# Patient Record
Sex: Male | Born: 1985 | Race: White | Hispanic: No | Marital: Single
Health system: Midwestern US, Community
[De-identification: ages and names within clinical notes are randomized; demographics above are authoritative.]

## PROBLEM LIST (undated history)

## (undated) DIAGNOSIS — S82899A Other fracture of unspecified lower leg, initial encounter for closed fracture: Secondary | ICD-10-CM

## (undated) DIAGNOSIS — Z915 Personal history of self-harm: Secondary | ICD-10-CM

## (undated) DIAGNOSIS — F209 Schizophrenia, unspecified: Secondary | ICD-10-CM

## (undated) DIAGNOSIS — F319 Bipolar disorder, unspecified: Secondary | ICD-10-CM

## (undated) DIAGNOSIS — F603 Borderline personality disorder: Secondary | ICD-10-CM

---
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
## 2010-12-08 NOTE — ED Notes (Signed)
[employer] placed on tray at bedside per patient's request.
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
## 2010-12-08 NOTE — ED Notes (Signed)
"  Hearing voices telling me to kill myself and hurt somebody else."

## 2010-12-09 LAB — DRUG SCREEN, URINE
ACETAMINOPHEN: NEGATIVE
AMPHETAMINES: NEGATIVE
BARBITURATES: NEGATIVE
BENZODIAZEPINES: NEGATIVE
COCAINE: NEGATIVE
METHADONE: NEGATIVE
Methamphetamines: NEGATIVE
OPIATES: NEGATIVE
PCP(PHENCYCLIDINE): NEGATIVE
THC (TH-CANNABINOL): POSITIVE — AB
TRICYCLICS: NEGATIVE

## 2010-12-09 LAB — ETHYL ALCOHOL: ALCOHOL(ETHYL),SERUM: <3 MG/DL (ref 0–3)

## 2010-12-09 MED ORDER — ACETAMINOPHEN 500 MG TAB
500 mg | ORAL | Status: AC
Start: 2010-12-09 — End: 2010-12-08
  Administered 2010-12-09: 03:00:00 via ORAL
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
## 2010-12-09 NOTE — ED Notes (Signed)
Pt ambulated to bathroom without difficulty.
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
## 2010-12-09 NOTE — ED Notes (Signed)
Pt resting on stretcher with eyes closed. Awaiting bed placement from crisis stabilization in [place]. Will continue to monitor.

## 2010-12-09 NOTE — ED Notes (Signed)
Pt awake asking for clothes, spoke to [place] at [facility] who states they will not be accepting pt.
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
## 2010-12-09 NOTE — ED Notes (Signed)
Per [facility] officer, Eduard Roux [phone]), updated vital signs need to be sent to [facility].

## 2010-12-09 NOTE — ED Notes (Signed)
[phone] (number); [phone] (fax)

## 2010-12-09 NOTE — ED Notes (Signed)
Crisis counselor, Hessie Diener at bedside.

## 2010-12-09 NOTE — ED Notes (Signed)
[employer] contacted. The unit reports that faxed information was never received. Needed information re-faxed. Arnela RN confirms that information was received.

## 2010-12-09 NOTE — ED Notes (Signed)
Bedside shift change report given to Clair Holden, RN (oncoming nurse) by Derra Shartzer, RN (offgoing nurse).  Report given with ED Summary.

## 2010-12-09 NOTE — Other (Signed)
Unable to locate client in ED. Per nursing staff he has eloped, [employer] notified.

## 2010-12-09 NOTE — ED Notes (Signed)
Patient provided with breakfast tray, sitting up on stretcher eating. Denies any needs at this time.

## 2010-12-09 NOTE — ED Notes (Signed)
Pt walked out, states "I signed myself in and I'm walking out" pt left without belongings, [employer] contacted spoke to officer Melodye Ped.

## 2010-12-09 NOTE — ED Notes (Signed)
[facility] couselor, Darral Dash, at bedside.

## 2010-12-09 NOTE — ED Provider Notes (Addendum)
HPI     Past Medical History   Diagnosis Date   ??? Psychiatric disorder    ??? Paranoid schizophrenia    ??? Heroin addiction    ??? Sciatic nerve pain         Past Surgical History   Procedure Date   ??? Hx orthopaedic      right knee         No family history on file.     History     Social History   ??? Marital Status: Single     Spouse Name: N/A     Number of Children: N/A   ??? Years of Education: N/A     Occupational History   ??? Not on file.     Social History Main Topics   ??? Smoking status: Current Everyday Smoker -- 3.0 packs/day for 16 years   ??? Smokeless tobacco: Not on file   ??? Alcohol Use: Yes      occassionally   ??? Drug Use: Yes     Special: Marijuana, Heroin      Last Heroin use 2 days (snort)   ??? Sexually Active: Yes -- Male partner(s)     Other Topics Concern   ??? Not on file     Social History Narrative   ??? No narrative on file                  ALLERGIES: Review of patient's allergies indicates no known allergies.      Review of Systems    Filed Vitals:    12/08/10 2044 12/09/10 0318 12/09/10 0731   BP: 114/70 92/59 92/60    Pulse: 88 62 68   Temp: 97.1 ??F (36.2 ??C)  98.4 ??F (36.9 ??C)   Resp: 18 16 14    Height: 5\' 9"  (1.753 m)     Weight: 77.6 kg (171 lb 1.2 oz)     SpO2: 99% 100% 97%            Physical Exam     MDM     Differential Diagnosis; Clinical Impression; Plan:     Pt pending crissi stabiliziation placement. No medical complaints. General; AOX3.  Pulmonary; CTA-B.  Cardiac: RRR no MRG; Abd S/NT/ND. Plan:   obs        Procedures

## 2010-12-09 NOTE — Other (Signed)
[facility], Lisette Abu, contacted about current status of bed request to the [facility]. She will notify [facility] as soon as possible.

## 2011-01-25 MED ORDER — OXYCODONE-ACETAMINOPHEN 5 MG-325 MG TAB
5-325 mg | ORAL_TABLET | ORAL | Status: DC | PRN
Start: 2011-01-25 — End: 2013-01-26

## 2011-01-25 NOTE — ED Provider Notes (Signed)
Patient is a 25 y.o. male presenting with rash. The history is provided by the patient.   Rash      C/o 4 day Hx painful rash on right mid back around to right side of his chest.  Noted blisters.  States the pain started the day before the rash appeared.  No fever or chills.  No NVD.  Normal appetite and activity level.  No recent travel.  No other family illness.    Past Medical History   Diagnosis Date   ??? Psychiatric disorder    ??? Paranoid schizophrenia    ??? Heroin addiction    ??? Sciatic nerve pain         Past Surgical History   Procedure Date   ??? Hx orthopaedic      right knee         No family history on file.     History     Social History   ??? Marital Status: Single     Spouse Name: N/A     Number of Children: N/A   ??? Years of Education: N/A     Occupational History   ??? Not on file.     Social History Main Topics   ??? Smoking status: Current Everyday Smoker -- 3.0 packs/day for 16 years   ??? Smokeless tobacco: Not on file   ??? Alcohol Use: Yes      occassionally   ??? Drug Use: Yes     Special: Marijuana, Heroin   ??? Sexually Active: Yes -- Male partner(s)     Other Topics Concern   ??? Not on file     Social History Narrative   ??? No narrative on file                  ALLERGIES: Review of patient's allergies indicates no known allergies.      Review of Systems   Constitutional: Negative for fever, chills, appetite change and fatigue.   HENT: Negative for congestion, rhinorrhea, neck pain, neck stiffness and postnasal drip.    Respiratory: Negative for cough, chest tightness and wheezing.    Cardiovascular: Positive for chest pain. Negative for palpitations and leg swelling.   Gastrointestinal: Negative for nausea, vomiting and abdominal pain.   Musculoskeletal: Positive for myalgias and back pain. Negative for joint swelling and gait problem.   Skin: Positive for rash. Negative for pallor.   Neurological: Negative for dizziness, weakness, light-headedness and headaches.       Filed Vitals:    01/25/11 1540    BP: 125/70   Pulse: 95   Temp: 99.3 ??F (37.4 ??C)   Resp: 16   Height: 5\' 11"  (1.803 m)   Weight: 79.379 kg (175 lb)   SpO2: 98%            Physical Exam   Constitutional: He is oriented to person, place, and time. He appears well-developed and well-nourished. No distress.   HENT:   Head: Normocephalic and atraumatic.   Neck: Normal range of motion. Neck supple.   Cardiovascular: Normal rate, regular rhythm and normal heart sounds.    Pulmonary/Chest: Effort normal and breath sounds normal. He exhibits tenderness.   Abdominal: Soft. Bowel sounds are normal. He exhibits no distension. There is no tenderness. There is no rebound and no guarding.   Musculoskeletal: Normal range of motion.   Neurological: He is alert and oriented to person, place, and time.   Skin: Skin is warm and dry. Rash noted.  erythmatous patchy rash right mid back around to right side chest.  No induration        MDM    Procedures

## 2011-01-25 NOTE — ED Notes (Signed)
Awaiting co signature for discharge.

## 2011-01-25 NOTE — ED Notes (Signed)
C/O blisters on chest radiating to back over the past 3 days

## 2011-01-25 NOTE — ED Provider Notes (Signed)
I was personally available for consultation in the emergency department.  I have reviewed the chart and agree with the documentation recorded by the MLP, including the assessment, treatment plan, and disposition.  Roselene Gray E Mckaylin Bastien

## 2011-01-25 NOTE — ED Notes (Signed)
Pt with noted multiple areas of blistering to mid back and some to right side of chest.  Pt reports pain x 3 with onset.  Pt denies any nausea or vomiting.  Pt denies any other concerns and presents to ED in NAD.

## 2011-01-25 NOTE — ED Notes (Signed)
I have reviewed discharge instructions with the patient. Prescriptions were reveiwed with patient and patient instructed not to drink alcohol, drive a car, or operate heavy machinery while taking this medicine. The patient verbalized understanding. Patient seen leaving ED  Ambulatory without difficulty, in no sign of distress. Patient armband removed and shredded

## 2012-08-30 NOTE — ED Provider Notes (Signed)
[facility]  EMERGENCY DEPARTMENT TREATMENT REPORT  NAME:  Gabriel Robinson  SEX:   M  ADMIT: 08/30/2012  DOB:   [birth year]  MR#    [id number]  ROOM:    TIME SEEN: 02 35 PM  ACCT#  [id number]    cc: Ila Mcgill MD    TIME OF EVALUATION:  1334    PRIMARY CARE PROVIDER:  Ila Mcgill, MD    CHIEF COMPLAINT:  Low back pain.    HISTORY OF PRESENT ILLNESS:  This is a 27 year old male presenting to the Emergency Department secondary to   what he calls the sciatic nerve.  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  States that he believes he may have worked   too hard.  Soon thereafter began developing low back pain, worse on the right   hand side, sharp, radiating down his right leg.  He does have history of   having sciatica in the past.  He has not taken any over-the-counter   medications for his pain, but due to his symptoms, worsening pain, he is here   for further evaluation.    REVIEW OF SYSTEMS:  CONSTITUTIONAL:  No fever.  RESPIRATORY:  No shortness of breath.  CARDIOVASCULAR:  No chest pain.  GASTROINTESTINAL:  No abdominal pain, vomiting, diarrhea.  MUSCULOSKELETAL:  Positive for back pain radiating to the right leg.    PAST MEDICAL HISTORY:  Sciatica, schizophrenia, anxiety.    SOCIAL HISTORY:  Positive for tobacco use.    FAMILY HISTORY:  Noncontributory.    MEDICATIONS:  None.    ALLERGIES:  NO KNOWN DRUG ALLERGIES.    PHYSICAL EXAMINATION:  VITAL SIGNS:  Blood pressure 130/84, pulse 84, respirations 17, temperature   98.4, pain 8 out of 10, O2 saturation 97% on room air.  GENERAL:  The patient well nourished, well developed, answering questions   appropriately, in no acute distress.  ENT:  Mouth:  Mucous membranes moist.  RESPIRATORY:  Clear and equal breath sounds.  No respiratory distress,   tachypnea, or accessory muscle use.    CARDIOVASCULAR:  Heart regular, without murmurs, gallops, rubs, or thrills.   GASTROINTESTINAL:  Normoactive bowel sounds.  Abdomen soft and nontender.  MUSCULOSKELETAL:  The patient has no localized midline tenderness to the   cervical, thoracic, lumbar, or sacral bodies.  He is tender paravertebrally of   the low lumbar spine on the right hand side extending into the buttocks   region.  Movement of the back does reproduce pain.  The patient does have a   positive straight leg raise to the right leg.  NEUROLOGIC:  Sensation to bilateral lower extremities intact.  Motor strength   equal and symmetric with dorsi and plantar flexion.    INITIAL ASSESSMENT AND MANAGEMENT PLAN:  This is a 27 year old male here with low back pain, radicular symptoms to the   right leg, suspect secondary to sciatica.  The patient medicated for his pain   while here with IM Dilaudid, IM Toradol, and Zofran ODT.  Based on his   worsening symptoms, I will go ahead and start him on a steroid taper.  He   otherwise denies any bowel or bladder incontinence.  The patient will also be   given Norco and Robaxin for pain and muscle relaxation.  The patient to follow   with his doctor, return for new or worsening symptoms.    CLINICAL IMPRESSION AND DIAGNOSIS:  Sciatica.  DISPOSITION:  The patient discharged home, instructed as above.    The patient was personally evaluated by myself and Dr. Henrene Hawking who agrees with   the above assessment and plan.      ___________________  Konrad Felix MD  Dictated By: Kristeen Mans, PA    My signature above authenticates this document and my orders, the final   diagnosis (es), discharge prescription (s), and instructions in the [facility]   Pulsecheck record.    If you have any questions please contact [phone].    SD  D:08/30/2012 14:35:51  T: 08/30/2012 14:58:53  [id number]  Authenticated by Janett Billow. Henrene Hawking, M.D. On 09/13/2012 02:58:00 PM

## 2013-01-04 NOTE — ED Provider Notes (Signed)
[facility]  EMERGENCY DEPARTMENT TREATMENT REPORT  NAME:  Gabriel Robinson  SEX:   M  ADMIT: 01/04/2013  DOB:   [birth year]  MR#    [id number]  ROOM:    TIME DICTATED: 09 00 PM  ACCT#  [id number]        ADDENDUM BY DR. Nedra Hai WOODARD:     I interviewed and examined the patient.  I discussed with the mid-level   provider and agree with their evaluation and plan as documented here.  The   patient called me back to the room after the nurse tried to discharge him,   asking for narcotic pain medication to be prescribed.  He states he usually   takes 30 mg of oxycodone a day for his back pain, but he has not been able to   get in to see Dr. Madelin Rear because of his new job and he has run out of this   medication.  I told him we do not prescribe medications for chronic pain.  He   did get a dose of Dilaudid here IM and I will give him a prescriptions for six   5 mg oxycodone tablets, but he understands this is not going to be repeated.      ___________________  Pennie Rushing MD  Dictated By: Bettey Mare. Earlene Plater, PA-C    My signature above authenticates this document and my orders, the final  diagnosis (es), discharge prescription (s), and instructions in the [facility]   Pulsecheck record.  Nursing notes have been reviewed by the physician/mid-level provider.    If you have any questions please contact [phone].    JM  D:01/04/2013 21:00:06  T: 01/04/2013 21:23:34  [id number]  Authenticated by Earvin Hansen. Clydene Pugh, M.D. On 01/09/2013 04:17:18 PM

## 2013-01-04 NOTE — ED Provider Notes (Signed)
[facility]  EMERGENCY DEPARTMENT TREATMENT REPORT  NAME:  Gabriel Robinson  SEX:   M  ADMIT: 01/04/2013  DOB:   [birth year]  MR#    [id number]  ROOM:    TIME DICTATED: 08 27 PM  ACCT#  [id number]    cc: Ila Mcgill MD    TIME OF SERVICE:   1947     CHIEF COMPLAINT:  Low back pain.    HISTORY OF PRESENT ILLNESS:  This is a 27 year old male who has had right-sided back pain radiating down   his right lower extremity for a long time.  He says the pain is there all the   time, but is increased with movement.  He has had it for a long time and is   treated by his primary care physician, Dr. Madelin Rear, but his Medicaid just ran   out, so he presents here for the pain.  He states today it got exacerbated at   work.  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  States the pain at this   time is a 9 out of 10, especially when he is moving.  He is able to move, but   it makes the pain worse.  He is denying any numbness or weakness of his lower   extremities.  The pain is aching, throbbing in nature, and occasionally sharp   down his leg.  The pain was worsened at work today.  He is denying any saddle   paresthesias, change in bowel or bladder habits, recent fevers, chills or   abdominal pain.  The pain is typical for him and not unusual.    REVIEW OF SYSTEMS:  CONSTITUTIONAL:  No fevers or chills.  GASTROINTESTINAL:  No abdominal pain.  GENITOURINARY:  No change in bowel or bladder habits.  MUSCULOSKELETAL:  Positive right-sided back pain radiating down right lower   extremity.  INTEGUMENTARY:  No open wounds or rashes.  NEUROLOGIC:  No sensory or motor deficit to the lower extremities.    PAST MEDICAL HISTORY:  Sciatica, schizophrenia and anxiety.    FAMILY HISTORY:  Noncontributory.    SOCIAL HISTORY:  He is a tobacco smoker.  Denies alcohol or drug use.    ALLERGIES:  REVIEWED IN IBEX.    MEDICATIONS:  Reviewed in Ibex.    PHYSICAL EXAMINATION:  VITAL SIGNS:  Blood pressure 141/84, pulse 67, respirations 18,  temperature   97.8, sats 97% on room air.  GENERAL APPEARANCE: The patient appears well developed and well nourished.    Appearance and behavior are age and situation appropriate.   RESPIRATORY:  Clear and equal breath sounds.  No respiratory distress,   tachypnea, or accessory muscle use.   CARDIOVASCULAR:  Heart regular, without murmurs, gallops, rubs, or thrills.   NECK:  Supple, symmetrical.  Trachea is midline.  He is moving his neck   appropriately on exam with no midline or paraspinal neck discomfort to   palpation.  GASTROINTESTINAL:  Abdomen soft, nontender to palpation.  MUSCULOSKELETAL:   Spine:  There is no localized cervical, thoracic, lumbar or   sacral bony tenderness to palpation or fist percussion.  There are no bony   step-offs, ecchymoses, areas of soft tissue swelling or deformities.  The   patient is tender to palpation just to the right of the lumbosacral spine of   the paraspinal musculature with a little tightening appreciated.  When I push   there, he states the pain shoots down his  right lower extremity.  He is able   to move both lower extremities without difficulty on exam, demonstrating 5/5   strength.  He has no left-sided back discomfort on palpation or  midline   tenderness along the spine.  No overlying erythema, edema, ecchymoses on   inspection of the spine or the back.  No soft tissue swelling or deformities.  SKIN:  Warm and dry.  NEUROLOGIC:  Alert, oriented.  Sensation intact, motor strength equal and   symmetric.     INITIAL ASSESSMENT AND MANAGEMENT PLAN:   This is a 27 year old male coming in for evaluation of sciatica pain.  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  This is a chronic problem for this   patient with an acute exacerbation.    EMERGENCY DEPARTMENT COURSE:    He was given one of IM Dilaudid and 25 IM Phenergan for his pain.    DIAGNOSIS:  Sciatica.    DISPOSITION:  He is discharged in stable condition with sciatica instructions, scripts for   p.o.  Naprosyn and Robaxin.  He is to follow up with primary care physician,   Dr. Ila Mcgill and orthopedist, Dr. Teressa Lower if symptoms persist.  Return   with new or worsening concerns.  The patient was personally evaluated by   myself and Dr. Lucita Ferrara, who agrees with the above assessment and plan.      ___________________  Pennie Rushing MD  Dictated By: Bettey Mare. Earlene Plater, PA-C    My signature above authenticates this document and my orders, the final  diagnosis (es), discharge prescription (s), and instructions in the [facility]   Pulsecheck record.  Nursing notes have been reviewed by the physician/mid-level provider.    If you have any questions please contact [phone].    JM  D:01/04/2013 20:27:15  T: 01/04/2013 20:41:41  [id number]  Authenticated by Earvin Hansen. Clydene Pugh, M.D. On 01/04/2013 09:59:30 PM

## 2013-01-26 NOTE — ED Notes (Signed)
Pt aox3. Pt states his sciatic nerve is killing him. Pt friend at the bedside. Pt states he is out of his oxycodone which he is prescribed to take once a day. Pt states he has been out of medication for one week. No distress noted.

## 2013-01-26 NOTE — ED Notes (Signed)
States lower back pain,  States his sciatic nerve is being pinched
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
## 2013-01-27 MED ADMIN — methocarbamol (ROBAXIN) tablet 500 mg: ORAL | @ 05:00:00 | NDC 68084005611

## 2013-01-27 MED ADMIN — HYDROmorphone (PF) (DILAUDID) injection 1 mg: INTRAMUSCULAR | @ 05:00:00 | NDC 00409128331

## 2013-01-27 MED ADMIN — ibuprofen (MOTRIN) tablet 800 mg: ORAL | @ 05:00:00 | NDC 00904585361

## 2013-01-27 NOTE — ED Notes (Signed)
I have reviewed discharge instructions with the patient.  The patient verbalized understanding.Patient armband removed and shredded

## 2013-01-27 NOTE — ED Provider Notes (Signed)
I was personally available for consultation in the emergency department. I have reviewed the chart prior to the patient's discharge and agree with the documentation recorded by the MLP, including the assessment, treatment plan, and disposition.

## 2013-01-27 NOTE — ED Notes (Signed)
Pt aox3. Pt friend at the bedside.  Pt friend states that she is driving home. Pt states he feels much better. No distress noted. Vitals stable.

## 2013-02-22 NOTE — ED Provider Notes (Signed)
[facility]  EMERGENCY DEPARTMENT TREATMENT REPORT  NAME:  Gabriel Robinson  SEX:   M  ADMIT: 02/22/2013  DOB:   1986-01-12  MR#    [id number]  ROOM:    TIME DICTATED: 02 35 PM  ACCT#  [id number]    cc: Ila Mcgill MD    TIME OF EVALUATION:  1420    PRIMARY  CARE DOCTOR:  Dr. Ila Mcgill    CHIEF COMPLAINT:  Abscess.    HISTORY OF PRESENT ILLNESS:  This is a 27 year old male with an abscess on his back just superior to his   gluteal cleft located to the lateral aspect.  He states that he has had this   several times in the past and had it drained previously.  It recurs, he   typically lets it come to a head and then he drains it himself, however, it   has been bothering him a little more recently.  It started coming back about a   week ago.  He has been able to get a little bit of discharge out of it, but   it is quite painful now, so he came in for evaluation.  He has not been taking   any medication for the pain.  He has been using some warm compresses, but not   working at this point.  Sitting on it makes it worse, getting off of it makes   it feel better.  He otherwise denies any fever, cough, chest pain, shortness   of breath, abdominal pain, nausea, vomiting, diarrhea.    REVIEW OF SYSTEMS:  CONSTITUTIONAL:  No fever, chills, weight loss.    RESPIRATORY:  No cough, shortness of breath, or wheezing.    CARDIOVASCULAR:  No chest pain, chest pressure, or palpitations.    MUSCULOSKELETAL:  Positive for pain in the lower back.    PAST MEDICAL HISTORY:  The patient has no pertinent past medical or surgical history.    PSYCHIATRIC HISTORY:  Anxiety, schizophrenia.    FAMILY HISTORY:  Noncontributory in this case.    SOCIAL HISTORY:  The patient smokes cigarettes, denies alcohol use and drug abuse.    ALLERGIES:   NO KNOWN DRUG ALLERGIES.    MEDICATIONS:  He currently takes oxycodone and Xanax.    PHYSICAL EXAMINATION:  VITAL SIGNS:  Blood pressure is 118/62, pulse 76, respirations 17, temperature    97.4, saturating at 99% on room air.  GENERAL APPEARANCE:  The patient appears well developed and well nourished.    Appearance and behavior are age and situation appropriate.   RESPIRATORY:  Clear and equal breath sounds.  No respiratory distress,   tachypnea, or accessory muscle use.   CARDIOVASCULAR:  Heart regular, without murmurs, gallops, rubs, or thrills.   MUSCULOSKELETAL:  The patient does have an approximately 1 cm diameter abscess   on the back just superior to the gluteal cleft and located just lateral to   the gluteal cleft.  There is an area of induration surrounding approximately 2   cm in diameter.  There is no drainage coming from the abscess at this  time   and it is acutely tender to palpation.   NEUROLOGIC:  The patient alert and oriented times 3.  He is able to use all   his extremities, upper and lower bilaterally without difficulty.    CONTINUED BY JEFFREY PADGETT, PA-C:     IMPRESSION AND PLAN:   This is a 27 year old male who presents for evaluation of abscess  on his   buttocks. The plan will be to I&D this. We will give him some pain medication   at discharge.      COURSE IN THE EMERGENCY DEPARTMENT:     PROCEDURE NOTE:  Performed by the PA. Area prepped with Betadine, local anesthesia obtained   with 1% lidocaine and I&D achieved with an 11 blade. Pus was evacuated, wound   cleansed with NS and packed with Iodoform gauze. Dressing applied and the   patient tolerated the procedure well. Wound was not packed.    DIAGNOSES:  Abscess with incision and drainage.  No antibiotics. No  packing.     The patient tolerated the procedure well.     DISPOSITION:  The patient remained completely stable during his course in the emergency   department. He was discharged home in stable condition.  Advised to return to   the emergency department if his condition worsens or new symptoms develop. I   wrote him a prescription for a few Norco for pain. Hot compresses 45 minutes    for the next day or two. The patient was agreeable to the plan. The patient   was seen and evaluated by myself with Dr. Carmela Hurt who agrees with above   assessment and plan.      ___________________  Christiana Pellant MD  Dictated By: Fransisca Kaufmann, PA-C    My signature above authenticates this document and my orders, the final  diagnosis (es), discharge prescription (s), and instructions in the [facility]   Pulsecheck record.  Nursing notes have been reviewed by the physician/mid-level provider.    If you have any questions please contact [phone].    JM  D:02/22/2013 14:35:33  T: 02/22/2013 15:58:32  [id number]  Authenticated by Carlis Stable. Carmela Hurt, M.D. On 02/26/2013 01:46:29 PM

## 2013-04-04 NOTE — ED Provider Notes (Signed)
[facility]  EMERGENCY DEPARTMENT TREATMENT REPORT  NAME:  Sloan LeiterARTER, Gabriel  SEX:   M  ADMIT: 04/03/2013  DOB:   June 12, 1985  MR#    [id number]  ROOM:    TIME DICTATED: 10 15 PM  ACCT#  [id number]        TIME OF EVALUATION:   2115.    CHIEF COMPLAINT:  Back pain.    HISTORY OF PRESENT ILLNESS:  The patient is a 28 year old male who presents complaining of low back pain,   described as constant aching pain for the last 2 days.  The patient states he   has a history of chronic low back pain for the last several years.  He is   currently on oxycodone 30 mg twice daily, however, states he ran out of this   medication 2 days ago and since then, has been having severe lower back pain.    He denies any use of over-the-counter medications.  Denies any new symptoms.    States that this pain is typical of his chronic lower back pain he has had   for years.  Denies any radiating pain or numbness and tingling to his lower   extremities and denies any new trauma or injury to his back.  Denies urinary   complaints including urinary retention and denies any fecal incontinence.    REVIEW OF SYSTEMS:  CONSTITUTIONAL:  No fever or chills.  RESPIRATORY:  No cough, shortness of breath, or wheezing.   CARDIOVASCULAR:  No chest pain, chest pressure, or palpitations.   GASTROINTESTINAL:  No vomiting, diarrhea, or abdominal pain.   GENITOURINARY:  No complaints.  MUSCULOSKELETAL:  Low back pain, chronic for years.  Denies any trauma.  INTEGUMENTARY:  Denies rashes.  NEUROLOGIC:  Denies sensory or motor symptoms.    PAST MEDICAL HISTORY:  History of anxiety, schizophrenia, sciatica, chronic low back pain.    CURRENT MEDICATIONS:  Xanax 2 mg p.o. twice daily.    ALLERGIES:  NO KNOWN DRUG ALLERGIES.    SOCIAL HISTORY:  The patient currently does use tobacco.  He denies any use of alcohol or   illicit drugs.    PHYSICAL EXAMINATION:  VITAL SIGNS:  Blood pressure 145/72, pulse 67, respirations 18, temperature    98.6 orally, pain 8 out of 10, O2 saturation 97% on room air.  GENERAL APPEARANCE:  The patient appears well-developed, well-nourished. He is   alert.  Full range of motion intact.    NECK:  No midline tenderness, no lymphadenopathy.  MUSCULOSKELETAL:  Tenderness to palpation over the lumbar spine with minimal   palpation.  Full range of motion of neck and back are intact.  The patient is   able to go from lying to sitting without difficulty.  Full range of motion of   lower extremities intact bilaterally.  No bony tenderness to palpation of   lower extremities bilaterally.  Distal pulses are 2+ seen  over back.    SKIN:  Warm, dry and intact with no evidence of rashes or erythema present.  NEUROLOGIC:  Strength and light touch sensation of lower extremities intact   and symmetric bilaterally.    INITIAL IMPRESSION:  A 28 year old male here for evaluation of low back pain which is actually a   chronic issue for this patient.  He states the pain has been exacerbated for   the last 2 days since he has been out of his pain medication.  He is here   requesting a refill of  his pain medication.  I discussed with the patient   given that this is a chronic issue, we typically do not refill his narcotic   pain medication, however, we will do so this once, but he is going to need to   follow up with his primary care physician for further pain management from   this point.  The patient is agreeable to this plan.  Again, this pain is   similar to the back pain he has had over the last years.  He denies any   worsening pain nor any new neurologic deficits.  He is neurologically intact   on my exam.  His vitals are stable and he is overall well-appearing.    FINAL DIAGNOSIS:  Acute on chronic low back pain.    DISPOSITION:  The patient is stable for discharge home.  The patient to follow up with his   primary care physician, Dr. Madelin Rear.  He is to see tomorrow to discuss further    evaluation and pain management.  He will be discharged home on Robaxin and 6   Norco, to be taken as directed, as needed for pain.  He is encouraged to   return to the ED for any new or worsening symptoms.  The patient was   personally evaluated by myself and Dr. Mickie Kay, who agrees with the above   assessment and plan.      ___________________  Candace Cruise MD  Dictated By: Truddie Crumble. Cathlyn Parsons, PA-C    My signature above authenticates this document and my orders, the final  diagnosis (es), discharge prescription (s), and instructions in the [facility]   Pulsecheck record.  Nursing notes have been reviewed by the physician/mid-level provider.    If you have any questions please contact [phone].    AK  D:04/03/2013 22:15:44  T: 04/04/2013 10:41:12  [id number]  Authenticated by Lyman Speller Luciano Cutter, M.D. On 04/06/2013 01:18:27 PM

## 2014-02-24 DIAGNOSIS — Z9151 Personal history of suicidal behavior: Secondary | ICD-10-CM

## 2014-02-24 HISTORY — DX: Personal history of suicidal behavior: Z91.51

## 2014-05-18 ENCOUNTER — Emergency Department (HOSPITAL_COMMUNITY)
Admission: EM | Admit: 2014-05-18 | Discharge: 2014-05-20 | Disposition: A | Discharge status: Home / Self Care | Payer: Self-pay | Attending: Emergency Medicine | Admitting: Emergency Medicine

## 2014-05-18 ENCOUNTER — Encounter (HOSPITAL_COMMUNITY): Payer: Self-pay | Admitting: Emergency Medicine

## 2014-05-18 ENCOUNTER — Emergency Department (HOSPITAL_COMMUNITY): Payer: Self-pay

## 2014-05-18 DIAGNOSIS — R05 Cough: Secondary | ICD-10-CM | POA: Insufficient documentation

## 2014-05-18 DIAGNOSIS — F603 Borderline personality disorder: Secondary | ICD-10-CM | POA: Insufficient documentation

## 2014-05-18 DIAGNOSIS — R112 Nausea with vomiting, unspecified: Secondary | ICD-10-CM | POA: Insufficient documentation

## 2014-05-18 DIAGNOSIS — R45851 Suicidal ideations: Secondary | ICD-10-CM

## 2014-05-18 DIAGNOSIS — F1110 Opioid abuse, uncomplicated: Secondary | ICD-10-CM | POA: Insufficient documentation

## 2014-05-18 DIAGNOSIS — F911 Conduct disorder, childhood-onset type: Secondary | ICD-10-CM | POA: Insufficient documentation

## 2014-05-18 DIAGNOSIS — R4689 Other symptoms and signs involving appearance and behavior: Secondary | ICD-10-CM

## 2014-05-18 DIAGNOSIS — F1310 Sedative, hypnotic or anxiolytic abuse, uncomplicated: Secondary | ICD-10-CM | POA: Insufficient documentation

## 2014-05-18 DIAGNOSIS — F319 Bipolar disorder, unspecified: Secondary | ICD-10-CM | POA: Insufficient documentation

## 2014-05-18 DIAGNOSIS — Z720 Tobacco use: Secondary | ICD-10-CM | POA: Insufficient documentation

## 2014-05-18 DIAGNOSIS — R45850 Homicidal ideations: Secondary | ICD-10-CM

## 2014-05-18 DIAGNOSIS — F209 Schizophrenia, unspecified: Secondary | ICD-10-CM | POA: Insufficient documentation

## 2014-05-18 DIAGNOSIS — Z8781 Personal history of (healed) traumatic fracture: Secondary | ICD-10-CM | POA: Insufficient documentation

## 2014-05-18 HISTORY — DX: Borderline personality disorder: F60.3

## 2014-05-18 HISTORY — DX: Personal history of self-harm: Z91.5

## 2014-05-18 HISTORY — DX: Bipolar disorder, unspecified: F31.9

## 2014-05-18 HISTORY — DX: Schizophrenia, unspecified: F20.9

## 2014-05-18 HISTORY — DX: Other fracture of unspecified lower leg, initial encounter for closed fracture: S82.899A

## 2014-05-18 LAB — COMPREHENSIVE METABOLIC PANEL
ALT: 22 U/L (ref 0–53)
ANION GAP: 12 (ref 5–15)
AST: 18 U/L (ref 0–37)
Albumin: 4.4 g/dL (ref 3.5–5.2)
Alkaline Phosphatase: 75 U/L (ref 39–117)
BUN: 7 mg/dL (ref 6–23)
CO2: 24 mmol/L (ref 19–32)
Calcium: 10.2 mg/dL (ref 8.4–10.5)
Chloride: 101 mmol/L (ref 96–112)
Creatinine, Ser: 0.9 mg/dL (ref 0.50–1.35)
GFR calc non Af Amer: >90 mL/min (ref >90)
GLUCOSE: 101 mg/dL — AB (ref 70–99)
POTASSIUM: 3.7 mmol/L (ref 3.5–5.1)
Sodium: 137 mmol/L (ref 135–145)
Total Bilirubin: 0.9 mg/dL (ref 0.3–1.2)
Total Protein: 7.7 g/dL (ref 6.0–8.3)

## 2014-05-18 LAB — CBC
HCT: 49.6 % (ref 39.0–52.0)
HEMOGLOBIN: 17.5 g/dL — AB (ref 13.0–17.0)
MCH: 32.1 pg (ref 26.0–34.0)
MCHC: 35.3 g/dL (ref 30.0–36.0)
MCV: 90.8 fL (ref 78.0–100.0)
Platelets: 307 10*3/uL (ref 150–400)
RBC: 5.46 MIL/uL (ref 4.22–5.81)
RDW: 13.2 % (ref 11.5–15.5)
WBC: 17.2 10*3/uL — ABNORMAL HIGH (ref 4.0–10.5)

## 2014-05-18 LAB — ETHANOL: Alcohol, Ethyl (B): <5 mg/dL (ref 0–9)

## 2014-05-18 LAB — ACETAMINOPHEN LEVEL

## 2014-05-18 LAB — RAPID URINE DRUG SCREEN, HOSP PERFORMED
Amphetamines: NOT DETECTED
Barbiturates: NOT DETECTED
Benzodiazepines: POSITIVE — AB
Cocaine: NOT DETECTED
Opiates: POSITIVE — AB
Tetrahydrocannabinol: POSITIVE — AB

## 2014-05-18 LAB — SALICYLATE LEVEL: Salicylate Lvl: <4 mg/dL (ref 2.8–20.0)

## 2014-05-18 MED ORDER — ZIPRASIDONE MESYLATE 20 MG IM SOLR
20.0000 mg | Freq: Once | INTRAMUSCULAR | Status: AC
  Administered 2014-05-18: 20 mg via INTRAMUSCULAR
  Filled 2014-05-18: qty 20

## 2014-05-18 MED ORDER — NICOTINE 21 MG/24HR TD PT24
21.0000 mg | MEDICATED_PATCH | Freq: Every day | TRANSDERMAL | Status: DC
  Administered 2014-05-19 – 2014-05-20 (×2): 21 mg via TRANSDERMAL
  Filled 2014-05-18 (×2): qty 1

## 2014-05-18 NOTE — ED Notes (Signed)
Clothing bagged and removed from room-- cell phone placed in security envelope, security called to wand pt--

## 2014-05-18 NOTE — ED Notes (Signed)
Pt states that he has been hearing voices since he was a child, voices are telling him to harm himself and others, no one in particular. 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 Pt agitated, throwing trash cans down hallway. Yelling at staff, pt throwing up in bathroom and mad that didn't getting new scrubs 'fast enough"

## 2014-05-18 NOTE — ED Notes (Signed)
Called house coverage and advised her of situation.  Staffing refused to send sitter until we moved the pt to Pod C. I Thayer Ohm(Chris) told House Coverage this was unacceptable. Talked to Emergency Nurse and she advised he needed a sitter now and he was a flight risk.  House Coverage Selena Batten(KIM) said she would call staffing and see what the issue was.

## 2014-05-18 NOTE — ED Provider Notes (Signed)
CSN: [id number]     Arrival date & time 05/18/14  1902 History   First MD Initiated Contact with Patient 05/18/14 2129     Chief Complaint  Patient presents with  . V70.1  . hearing voices   . Medical Clearance     (Consider location/radiation/quality/duration/timing/severity/associated sxs/prior Treatment) The history is provided by the patient. No language interpreter was used.  Cristian Payne is a 29 year old male with past medical history of schizophrenia, bipolar disorder, borderline personality disorder presenting to the ED with suicidal, homicidal, aggressive behavior. Patient reported that he has thoughts of hurting himself-reported that he had a plan to hang himself. Reported that he just wants to hurt others. Reported that voices in his head or telling him to hurt others. Reported that he was just released from [place] a couple months ago and stated that he has not had his Haldol in approximately 2 months. Patient reports that he takes trazodone, Seroquel, Haldol injections monthly. Reported that his last Haldol injection was 2 months ago. Patient reports that he just cannot keep it straight, reported that he is going to "crack" if he does not get his medications quickly. Patient reports that he has been having a cough and some shortness of breath associated with the cough only. While in ED setting, patient had episode of vomiting-reported that he threw up all of his [place] sandiches that he had earlier today. Stated that he was seen at [facility] couple months ago and was admitted for approximately 2 weeks regarding SI and HI. Denied having a primary care provider here in [place]. He states that he smokes approximately 2 packs of cigarettes per day. Denied heroin, cocaine, marijuana, alcohol. Denied recent overdose or attempt - reported that he attempted a couple of months ago. Denied fever, chills, chest pain, difficulty breathing, abdominal pain, diarrhea, melena, hematochezia, blurred  vision, sudden loss of vision, neck pain, neck stiffness, head injury. PCP none  Past Medical History  Diagnosis Date  . Schizophrenia   . Bipolar 1 disorder   . Borderline personality disorder   . Fx ankle     right   History reviewed. No pertinent past surgical history. No family history on file. History  Substance Use Topics  . Smoking status: Heavy Tobacco Smoker -- 2.00 packs/day  . Smokeless tobacco: Not on file  . Alcohol Use: No    Review of Systems  Constitutional: Negative for fever and chills.  Eyes: Negative for visual disturbance.  Respiratory: Positive for cough. Negative for chest tightness and shortness of breath.   Cardiovascular: Negative for chest pain.  Gastrointestinal: Positive for nausea and vomiting. Negative for abdominal pain, diarrhea, constipation, blood in stool and anal bleeding.  Musculoskeletal: Negative for back pain, neck pain and neck stiffness.  Neurological: Negative for dizziness, weakness, numbness and headaches.  Psychiatric/Behavioral: Positive for suicidal ideas, hallucinations, behavioral problems and agitation. Negative for decreased concentration.      Allergies  Review of patient's allergies indicates no known allergies.  Home Medications   Prior to Admission medications   Not on File   BP 138/94 mmHg  Pulse 74  Temp(Src) 98.6 F (37 C) (Oral)  Resp 16  Ht  (1.778 m)  Wt 203 lb 9.6 oz (92.352 kg)  BMI 29.21 kg/m2  SpO2 96% Physical Exam  Constitutional: He is oriented to person, place, and time. He appears well-developed and well-nourished. No distress.  HENT:  Head: Normocephalic and atraumatic.  Mouth/Throat: Oropharynx is clear and moist. No  oropharyngeal exudate.  Eyes: Conjunctivae and EOM are normal. Right eye exhibits no discharge. Left eye exhibits no discharge.  Neck: Normal range of motion. Neck supple. No tracheal deviation present.  Cardiovascular: Normal rate, regular rhythm and normal heart  sounds.  Exam reveals no friction rub.   No murmur heard. Pulmonary/Chest: Effort normal and breath sounds normal. No respiratory distress. He has no wheezes. He has no rales.  Abdominal: Soft. Bowel sounds are normal. He exhibits no distension. There is no tenderness. There is no rebound and no guarding.  Musculoskeletal: Normal range of motion.  Lymphadenopathy:    He has no cervical adenopathy.  Neurological: He is alert and oriented to person, place, and time. No cranial nerve deficit. He exhibits normal muscle tone. Coordination normal.  Skin: Skin is warm and dry. He is not diaphoretic.  Psychiatric: His speech is normal. His affect is angry. He is agitated and aggressive. He expresses suicidal ideation. He expresses suicidal plans.  Appears very agitated Wants medications - continues to ask for haldol  Patient sitting upright with clenched fists and shaking legs Will intermittently make eye contact with this provider, but not fully  Angry demeanor  Nursing note and vitals reviewed.   ED Course  Procedures (including critical care time)  Results for orders placed or performed during the hospital encounter of 05/18/14  Acetaminophen level  Result Value Ref Range   Acetaminophen (Tylenol), Serum <10.0 (L) 10 - 30 ug/mL  CBC  Result Value Ref Range   WBC 17.2 (H) 4.0 - 10.5 K/uL   RBC 5.46 4.22 - 5.81 MIL/uL   Hemoglobin 17.5 (H) 13.0 - 17.0 g/dL   HCT 40.9 81.1 - 91.4 %   MCV 90.8 78.0 - 100.0 fL   MCH 32.1 26.0 - 34.0 pg   MCHC 35.3 30.0 - 36.0 g/dL   RDW 78.2 95.6 - 21.3 %   Platelets 307 150 - 400 K/uL  Comprehensive metabolic panel  Result Value Ref Range   Sodium 137 135 - 145 mmol/L   Potassium 3.7 3.5 - 5.1 mmol/L   Chloride 101 96 - 112 mmol/L   CO2 24 19 - 32 mmol/L   Glucose, Bld 101 (H) 70 - 99 mg/dL   BUN 7 6 - 23 mg/dL   Creatinine, Ser 0.86 0.50 - 1.35 mg/dL   Calcium 57.8 8.4 - 46.9 mg/dL   Total Protein 7.7 6.0 - 8.3 g/dL   Albumin 4.4 3.5 - 5.2 g/dL    AST 18 0 - 37 U/L   ALT 22 0 - 53 U/L   Alkaline Phosphatase 75 39 - 117 U/L   Total Bilirubin 0.9 0.3 - 1.2 mg/dL   GFR calc non Af Amer >90 >90 mL/min   GFR calc Af Amer >90 >90 mL/min   Anion gap 12 5 - 15  Ethanol (ETOH)  Result Value Ref Range   Alcohol, Ethyl (B) <5 0 - 9 mg/dL  Salicylate level  Result Value Ref Range   Salicylate Lvl <4.0 2.8 - 20.0 mg/dL  Urine Drug Screen  Result Value Ref Range   Opiates POSITIVE (A) NONE DETECTED   Cocaine NONE DETECTED NONE DETECTED   Benzodiazepines POSITIVE (A) NONE DETECTED   Amphetamines NONE DETECTED NONE DETECTED   Tetrahydrocannabinol POSITIVE (A) NONE DETECTED   Barbiturates NONE DETECTED NONE DETECTED    Labs Review Labs Reviewed  ACETAMINOPHEN LEVEL - Abnormal; Notable for the following:    Acetaminophen (Tylenol), Serum <10.0 (*)    All  other components within normal limits  CBC - Abnormal; Notable for the following:    WBC 17.2 (*)    Hemoglobin 17.5 (*)    All other components within normal limits  COMPREHENSIVE METABOLIC PANEL - Abnormal; Notable for the following:    Glucose, Bld 101 (*)    All other components within normal limits  URINE RAPID DRUG SCREEN (HOSP PERFORMED) - Abnormal; Notable for the following:    Opiates POSITIVE (*)    Benzodiazepines POSITIVE (*)    Tetrahydrocannabinol POSITIVE (*)    All other components within normal limits  ETHANOL  SALICYLATE LEVEL    Imaging Review Dg Chest Portable 1 View  05/18/2014   CLINICAL DATA:  Medical clearance, psychiatric evaluation. Smoking history.  EXAM: PORTABLE CHEST - 1 VIEW  COMPARISON:  None.  FINDINGS: The cardiomediastinal contours are normal. The lungs are clear. Pulmonary vasculature is normal. No consolidation, pleural effusion, or pneumothorax. No acute osseous abnormalities are seen.  IMPRESSION: No acute pulmonary process.   Electronically Signed   By: Rubye Oaks M.D.   On: 05/18/2014 22:16     EKG Interpretation   Date/Time:   Thursday May 18 2014 23:17:31 EDT Ventricular Rate:  56 PR Interval:  156 QRS Duration: 100 QT Interval:  414 QTC Calculation: 399 R Axis:   18 Text Interpretation:  Sinus bradycardia Incomplete right bundle branch  block Septal infarct , age undetermined Abnormal ECG No old tracing to  compare Confirmed by GOLDSTON  MD, SCOTT ([id number]) on 05/19/2014 12:34:22 AM       10:13 PM Patient became rowdy in the ED. Patient cursing at staff and requesting medications as soon as possible. Patient kicking garbage bins. Security at bedside. Patient cursing and reported that if something is not done soon that he will "fuck everything up."  MDM   Final diagnoses:  Suicidal thoughts  Homicidal behavior  Aggressive behavior  Schizophrenia, unspecified type  Bipolar 1 disorder  Borderline personality disorder    Medications  nicotine (NICODERM CQ - dosed in mg/24 hours) patch 21 mg (not administered)  ziprasidone (GEODON) injection 20 mg (20 mg Intramuscular Given 05/18/14 2219)    Filed Vitals:   05/18/14 1939  BP: 138/94  Pulse: 74  Temp: 98.6 F (37 C)  TempSrc: Oral  Resp: 16  Height: 5\' 10"  (1.778 m)  Weight: 203 lb 9.6 oz (92.352 kg)  SpO2: 96%   EKG noted sinus bradycardia with a heart rate of 56 bpm. CBC noted mildly elevated white blood cell count 17.2. Hemoglobin 17.5, hematocrit 49.6. CMP unremarkable. Ethanol, salicylate, acetaminophen level negative elevation. Urinalysis positive for opiates, benzodiazepine, cannabis. Chest x-ray no acute pulmonary processes identified. Patient presenting to the ED with SI, HI, auditory and visual hallucinations. Patient has history of personality disorder, schizophrenia, bipolar disorder. Patient reported that he has not had his medications in over 2 months. Reported that he normally gets held all injections, but reported that his last Haldol injection was 2 months ago. Patient was aggressive and verbally abusive towards staff while in ED  setting-cursing at please department, nursing staff and set her. Patient kicked garbage bin due to not getting medications immediately. Discussed case in great detail with attending physician who administered Geodon to patient. Discussed case in great detail with attending physician, patient medically cleared chest x-ray negative. Negative findings for pneumonia. Patient stable, afebrile. Patient not septic appearing. Negative signs of respiratory distress. Patient will need TTS consult. Patient cannot leave the ED without having a  TTS consult, if patient becomes combative or refuses to stay IVC paperwork will need to be placed. Psych holding orders have been placed. Discussed case with Dr. Wilkie AyeHorton, overnight physician. Transfer of care at change in shift.   Raymon MuttonMarissa Fabiola Mudgett, PA-C 05/19/14 0104  Raymon MuttonMarissa Haliyah Fryman, PA-C 05/19/14 0106  Raymon MuttonMarissa Heston Widener, PA-C 05/19/14 0106  Raymon MuttonMarissa Garen Woolbright, PA-C 05/19/14 1521  Pricilla LovelessScott Goldston, MD 05/22/14 [phone]

## 2014-05-18 NOTE — ED Notes (Signed)
Pt states "I feel suicidal -- and I am hearing voices. I want to hang myself-- [place] with [place]-- a couple months ago. I also overdosed on pills"  States "the voices tell me to hurt other people and myself-- I feel like the devil is telling me to sacrifice myself-- my best friend -- a Programme researcher, broadcasting/film/videosatanist preacher hung himself and died-- 6 months ago"

## 2014-05-18 NOTE — BH Assessment (Addendum)
Tele Assessment Note   Cristian LeiterDouglas Ayuso is a single, employed, 29 y.o. male [facility]/o SI with plan and intent. He also reports HI and A/VH. Pt reports that his voices are telling him to harm himself and that his plan is to hang himself. Pt is very psychotic and aggressive with ED staff prior to assessment, as RN noted that pt reported that the devil was his friend and that he asked him to sacrifice himself. Per RN notes, pt was also throwing trash down hallways and being verbally abusive with staff. After pt was given Geodon to help him sleep, he continued to remain awake and restless. During behavioral health assessment, pt presented with poor eye-contact, depressed and angry mood, irritable affect, and more coherent thought processes. Speech was aggressive at times, as pt was fixated on receiving Haldol and not Geodon. Pt was poor historian and sometimes refused to provide answers to questions asked. Pt's recent stressors reportedly include his best friend recently committing suicide.   Pt reports being unable to sleep at night, feelings of hopelessness, feelings of worthlessness, anger outbursts, A/VH, and SI/HI. He reports at least 3 prior suicide attempts in the past, including trying to hang himself with a bed sheet. He denies any self-harming behaviors or hx of abuse. He denies SA but reports that he does smoke marijuana sometimes. Pt reports multiple psychiatric hospitalizations in the past, including a recent [facility].  Inpt treatment recommended by Hulan FessIjeoma Nwaeze, NP. [facility]500 hall[facility]. TTS to seek placement.  Axis I: Schizoaffective Disorder, Bipolar Type, by hx Axis II: Cluster B traits; Hx of BPD dx Axis III:  Past Medical History  Diagnosis Date  . Schizophrenia   . Bipolar 1 disorder   . Borderline personality disorder   . Fx ankle     right   Axis IV: Occupational problems, other psychosocial or  environmental problems, problems related to social environment, problems with access to health care services, problems with primary support group Axis V: 31-40 impairment in reality testing  Past Medical History:  Past Medical History  Diagnosis Date  . Schizophrenia   . Bipolar 1 disorder   . Borderline personality disorder   . Fx ankle     right    History reviewed. No pertinent past surgical history.  Family History: No family history on file.  Social History:  reports that he has been smoking.  He does not have any smokeless tobacco history on file. He reports that he does not drink alcohol or use illicit drugs.  Additional Social History:  Alcohol / Drug Use Pain Medications: See PTA List Prescriptions: See PTA List Over the Counter: See PTA List  CIWA: CIWA-Ar BP: 138/94 mmHg Pulse Rate: 74 COWS:    PATIENT STRENGTHS: (choose at least two) Average or above average intelligence Physical Health  Allergies: No Known Allergies  Home Medications:  (Not in a hospital admission)  OB/GYN Status:  No LMP for male patient.  General Assessment Data [facility] Is this a Tele or Face-to-Face Assessment?: Tele Assessment Is this an Initial Assessment or a Re-assessment for this encounter?: Initial Assessment Living Arrangements: Alone Can pt return to current living arrangement?: Yes Admission Status: Voluntary Is patient capable of signing voluntary admission?: Yes Transfer from: Unknown Referral Source: Self/Family/Friend     [facility] Living Arrangements: Alone Name of Psychiatrist: None Name of Therapist: None  Education Status  Is patient currently in school?: No Current Grade: na Highest grade of school patient has completed: na Name of school: na Contact person: na  Risk to self with the past 6 months Suicidal Ideation: Yes-Currently Present Suicidal Intent: Yes-Currently Present Is patient at risk for suicide?:  Yes Suicidal Plan?: Yes-Currently Present Specify Current Suicidal Plan: Hanging self Access to Means: Yes Specify Access to Suicidal Means: Any item used to hang self (e.g. sheet) What has been your use of drugs/alcohol within the last 12 months?: THC use within last year Previous Attempts/Gestures: Yes How many times?: 4 Other Self Harm Risks: None noted Triggers for Past Attempts: Unpredictable Intentional Self Injurious Behavior: None Family Suicide History: No Recent stressful life event(s): Loss (Comment), Other (Comment) (Best friend committed suicide recently; Increased A/VH) Persecutory voices/beliefs?: No Depression: Yes Depression Symptoms: Despondent, Insomnia, Feeling angry/irritable Substance abuse history and/or treatment for substance abuse?: Yes Suicide prevention information given to non-admitted patients: Not applicable  Risk to Others within the past 6 months Homicidal Ideation: Yes-Currently Present Thoughts of Harm to Others: Yes-Currently Present Comment - Thoughts of Harm to Others: Pt did not disclose Current Homicidal Intent: Yes-Currently Present Current Homicidal Plan: Yes-Currently Present Describe Current Homicidal Plan: "To act on my thoughts" Access to Homicidal Means: No Identified Victim: None History of harm to others?: Yes Assessment of Violence: On admission Violent Behavior Description: Pt very verbally threatening with staff in ED, Pt reports a hx of harm to others but would not elaborate Does patient have access to weapons?: No Criminal Charges Pending?: No Does patient have a court date: No  Psychosis Hallucinations: Auditory, Visual, With command Delusions: None noted  Mental Status Report Appearance/Hygiene: In scrubs Eye Contact: Poor Motor Activity: Agitation Speech: Aggressive Level of Consciousness: Restless Mood: Angry Affect: Irritable Anxiety Level: Moderate Thought Processes: Coherent, Relevant Judgement:  Impaired Orientation: Person, Place, Time Obsessive Compulsive Thoughts/Behaviors: None  Cognitive Functioning Concentration: Poor Memory: Unable to Assess IQ: Average Insight: Poor Impulse Control: Poor Appetite: Fair Weight Loss: 0 Weight Gain: 0 Sleep: Decreased Total Hours of Sleep: 5 Vegetative Symptoms: None  ADLScreening [facility]) Patient's cognitive ability adequate to safely complete daily activities?: Yes Patient able to express need for assistance with ADLs?: Yes Independently performs ADLs?: Yes (appropriate for developmental age)  Prior Inpatient Therapy Prior Inpatient Therapy: Yes Prior Therapy Dates: UTA Prior Therapy Facilty/Provider(s): UTA Reason for Treatment: SI  Prior Outpatient Therapy Prior Outpatient Therapy: No  ADL Screening (condition at time of admission) Patient's cognitive ability adequate to safely complete daily activities?: Yes Is the patient deaf or have difficulty hearing?: No Does the patient have difficulty seeing, even when wearing glasses/contacts?: No Does the patient have difficulty concentrating, remembering, or making decisions?: No Patient able to express need for assistance with ADLs?: Yes Does the patient have difficulty dressing or bathing?: No Independently performs ADLs?: Yes (appropriate for developmental age) Does the patient have difficulty walking or climbing stairs?: No Weakness of Legs: None Weakness of Arms/Hands: None  Home Assistive Devices/Equipment Home Assistive Devices/Equipment: None    Abuse/Neglect Assessment (Assessment to be complete while patient is alone) Physical Abuse: Denies Verbal Abuse: Denies Sexual Abuse: Denies Exploitation of patient/patient's resources: Denies Self-Neglect: Denies Values / Beliefs Cultural Requests During Hospitalization: None Spiritual Requests During Hospitalization: None   Advance Directives (For Healthcare) Does patient have an advance  directive?: No Would patient like information on creating an advanced directive?: No - patient declined information    Additional Information 1:1 In Past  12 Months?: No CIRT Risk: Yes Elopement Risk: Yes Does patient have medical clearance?: Yes     Disposition: Inpt treatment recommended by Hulan Fess, NP. No appropriate (500 hall) [facility] beds, per Adult Unit. TTS to seek placement. Disposition Initial Assessment Completed for this Encounter: Yes Disposition of Patient: Inpatient treatment program Type of inpatient treatment program: Adult  Cyndie Mull, [facility] Triage Specialist  05/19/2014 1:30 AM

## 2014-05-19 MED ORDER — HALOPERIDOL 5 MG PO TABS
5.0000 mg | ORAL_TABLET | Freq: Three times a day (TID) | ORAL | Status: DC | PRN
  Administered 2014-05-19 – 2014-05-20 (×3): 5 mg via ORAL
  Filled 2014-05-19 (×3): qty 1

## 2014-05-19 MED ORDER — ONDANSETRON HCL 4 MG PO TABS
4.0000 mg | ORAL_TABLET | Freq: Three times a day (TID) | ORAL | Status: DC | PRN
Start: 2014-05-19 — End: 2014-05-20

## 2014-05-19 MED ORDER — BUSPIRONE HCL 10 MG PO TABS
10.0000 mg | ORAL_TABLET | Freq: Three times a day (TID) | ORAL | Status: DC
  Administered 2014-05-19 – 2014-05-20 (×4): 10 mg via ORAL
  Filled 2014-05-19 (×4): qty 1

## 2014-05-19 MED ORDER — NICOTINE 21 MG/24HR TD PT24: 21.0000 mg | MEDICATED_PATCH | Freq: Every day | TRANSDERMAL | Status: DC

## 2014-05-19 MED ORDER — HALOPERIDOL 5 MG PO TABS
5.0000 mg | ORAL_TABLET | Freq: Once | ORAL | Status: AC
  Administered 2014-05-19: 5 mg via ORAL
  Filled 2014-05-19: qty 1

## 2014-05-19 MED ORDER — ACETAMINOPHEN 325 MG PO TABS: 650.0000 mg | ORAL_TABLET | ORAL | Status: DC | PRN

## 2014-05-19 MED ORDER — LORAZEPAM 1 MG PO TABS
1.0000 mg | ORAL_TABLET | Freq: Once | ORAL | Status: AC
  Administered 2014-05-19: 1 mg via ORAL
  Filled 2014-05-19: qty 1

## 2014-05-19 MED ORDER — QUETIAPINE FUMARATE 25 MG PO TABS: 300.0000 mg | ORAL_TABLET | Freq: Every day | ORAL | Status: DC

## 2014-05-19 MED ORDER — QUETIAPINE FUMARATE 25 MG PO TABS
300.0000 mg | ORAL_TABLET | Freq: Every day | ORAL | Status: DC
  Administered 2014-05-19: 300 mg via ORAL
  Filled 2014-05-19: qty 4
  Filled 2014-05-19: qty 1

## 2014-05-19 NOTE — ED Notes (Addendum)
Pt. Just vomited X 1 in the bathroom,  Pt. Refused any medication for vomiting. Pt. Stated, "I need Haldol"  Offered pt. New scrubs,  He refused them

## 2014-05-19 NOTE — Progress Notes (Signed)
CSW sought placement at the following facilities: [facility], fax referral (waitlist) [facility] [place] [facility], fax referral [facility], fax referral  Following up on referral packets already sent: [facility], declined due to acuity [facility], [facility], at capacity 05/19/14 [facility] [place], no adult male beds [facility], no beds 05/19/14 [facility] [place], will call back  Ilean SkillMeghan Shalayah Beagley, MSW, [facility] Clinical Social Work, Disposition Office 05/19/2014

## 2014-05-19 NOTE — ED Notes (Signed)
PATIENT BEGINNING TO BE UP AND DOWN IN ROOM AND HALL. HE STATES HIS THOUGHTS ARE RACING AND HE IS STARTING TO FEEL AGITATED.  HE REQUESTS A SHOT TO MAKE THEM STOP. ADDRESSED PT REQUEST WITH DR Manus GunningANCOUR. HE WILL COME SEE PT. HE IS AWARE OF PT BEHAVIOR OUTBURST LAST NIGHT.MD HAS ORDERED A DOSE OF PO ATIVAN

## 2014-05-19 NOTE — BHH Counselor (Signed)
TTS counselor sent referrals to the following facilities in effort to obtain inpt placement:  [facility]

## 2014-05-19 NOTE — ED Notes (Signed)
Spoke with Dr. Lajean Saverancor about ordering Psychiatric Hold orders for pt.

## 2014-05-19 NOTE — BHH Counselor (Signed)
Inpt treatment recommended by Hulan FessIjeoma Nwaeze, NP. [facility]500 hall[facility].   TTS to seek placement. Attending EDP (Dr Wilkie AyeHorton) made aware of disposition.   Cyndie MullAnna Bing Duffey, [facility] Triage Specialist

## 2014-05-20 ENCOUNTER — Encounter (HOSPITAL_COMMUNITY): Payer: Self-pay | Admitting: *Deleted

## 2014-05-20 DIAGNOSIS — R45850 Homicidal ideations: Secondary | ICD-10-CM

## 2014-05-20 DIAGNOSIS — F319 Bipolar disorder, unspecified: Secondary | ICD-10-CM | POA: Insufficient documentation

## 2014-05-20 DIAGNOSIS — R45851 Suicidal ideations: Secondary | ICD-10-CM

## 2014-05-20 MED ORDER — QUETIAPINE FUMARATE 300 MG PO TABS: 300.0000 mg | ORAL_TABLET | Freq: Every day | ORAL | Status: AC

## 2014-05-20 MED ORDER — HALOPERIDOL 5 MG PO TABS: 5.0000 mg | ORAL_TABLET | Freq: Three times a day (TID) | ORAL | Status: AC

## 2014-05-20 NOTE — Consult Note (Signed)
Telepsych Consultation   Reason for Consult:  Auditory Hallucinations Referring Physician:  EDP Patient Identification: Cristian Payne MRN:  [id number] Principal Diagnosis: <principal problem not specified> Diagnosis:   Patient Active Problem List   Diagnosis Date Noted  . Bipolar 1 disorder [F31.9]     Total Time spent with patient: 25 minutes   Subjective:   Cristian Payne is a 29 y.o. male patient admitted with reports of suicidal ideation and agitation. Pt seen and chart reviewed. Pt reports that he relocated from [place] 2 months ago and that he has been without his medication since then. He reports chronic auditory hallucinations since he was a small child and reports that his Haldol/Seroquel regimen is very effective in managing his symptoms but that he has been without a provider locally. Pt is apologetic about kicking over a trash can 2 days ago in the ED and reports that he was frustrated that he vomited on himself and needed new scrubs and they were "taking too long". Pt admits to being high on drugs at the time which he reports he does use to self-medication. Pt has good insight and reports that his baseline is not suicidal but that when he does not take his medications, he is overwhelmed by the voices and then becomes suicidal, moreso in combination with drug use. Pt denies SI, HI, and AVH at this time and is able to contract for safety.   HPI:  Cristian Payne is a single, employed, 29 y.o. male presenting to [facility] with c/o SI with plan and intent. He also reports HI and A/VH. Pt reports that his voices are telling him to harm himself and that his plan is to hang himself. Pt is very psychotic and aggressive with ED staff prior to assessment, as RN noted that pt reported that the devil was his friend and that he asked him to sacrifice himself. Per RN notes, pt was also throwing trash down hallways and being verbally abusive with staff. After pt was given Geodon to help him sleep, he  continued to remain awake and restless. During behavioral health assessment, pt presented with poor eye-contact, depressed and angry mood, irritable affect, and more coherent thought processes. Speech was aggressive at times, as pt was fixated on receiving Haldol and not Geodon. Pt was poor historian and sometimes refused to provide answers to questions asked. Pt's recent stressors reportedly include his best friend recently committing suicide.   Pt reports being unable to sleep at night, feelings of hopelessness, feelings of worthlessness, anger outbursts, A/VH, and SI/HI. He reports at least 3 prior suicide attempts in the past, including trying to hang himself with a bed sheet. He denies any self-harming behaviors or hx of abuse. He denies SA but reports that he does smoke marijuana sometimes. Pt reports multiple psychiatric hospitalizations in the past, including a recent 2-week admission to [facility] for SI/HI.  HPI Elements:   Location:  Psychiatric. Quality:  Improving, stable. Severity:  Moderate. Timing:  Intermittent. Duration:  Chronic in nature. Context:  Exacerbation of underlying history of schizophrenia due to abrupt cessation of medications with a 2 month hiatus. .  Past Medical History:  Past Medical History  Diagnosis Date  . Schizophrenia   . Bipolar 1 disorder   . Borderline personality disorder   . Fx ankle     right  . H/O: suicide attempt 02/2014    attempted to hang self   History reviewed. No pertinent past surgical history. Family History: No family history on file.  Social History:  History  Alcohol Use No     History  Drug Use No    History   Social History  . Marital Status: Single    Spouse Name: N/A  . Number of Children: N/A  . Years of Education: N/A   Social History Main Topics  . Smoking status: Heavy Tobacco Smoker -- 2.00 packs/day    Types: Cigarettes  . Smokeless tobacco: Never Used  . Alcohol Use: No  . Drug Use: No  . Sexual  Activity: Not on file   Other Topics Concern  . None   Social History Narrative   Additional Social History:    Pain Medications: See PTA List Prescriptions: See PTA List Over the Counter: See PTA List                     Allergies:  No Known Allergies  Labs:  Results for orders placed or performed during the hospital encounter of 05/18/14 (from the past 48 hour(s))  Urine Drug Screen     Status: Abnormal   Collection Time: 05/18/14  8:04 PM  Result Value Ref Range   Opiates POSITIVE (A) NONE DETECTED   Cocaine NONE DETECTED NONE DETECTED   Benzodiazepines POSITIVE (A) NONE DETECTED   Amphetamines NONE DETECTED NONE DETECTED   Tetrahydrocannabinol POSITIVE (A) NONE DETECTED   Barbiturates NONE DETECTED NONE DETECTED    Comment:        DRUG SCREEN FOR MEDICAL PURPOSES ONLY.  IF CONFIRMATION IS NEEDED FOR ANY PURPOSE, NOTIFY LAB WITHIN 5 DAYS.        LOWEST DETECTABLE LIMITS FOR URINE DRUG SCREEN Drug Class       Cutoff (ng/mL) Amphetamine      1000 Barbiturate      200 Benzodiazepine   740 Tricyclics       814 Opiates          300 Cocaine          300 THC              50   Acetaminophen level     Status: Abnormal   Collection Time: 05/18/14  8:08 PM  Result Value Ref Range   Acetaminophen (Tylenol), Serum <10.0 (L) 10 - 30 ug/mL    Comment:        THERAPEUTIC CONCENTRATIONS VARY SIGNIFICANTLY. A RANGE OF 10-30 ug/mL MAY BE AN EFFECTIVE CONCENTRATION FOR MANY PATIENTS. HOWEVER, SOME ARE BEST TREATED AT CONCENTRATIONS OUTSIDE THIS RANGE. ACETAMINOPHEN CONCENTRATIONS >150 ug/mL AT 4 HOURS AFTER INGESTION AND >50 ug/mL AT 12 HOURS AFTER INGESTION ARE OFTEN ASSOCIATED WITH TOXIC REACTIONS.   CBC     Status: Abnormal   Collection Time: 05/18/14  8:08 PM  Result Value Ref Range   WBC 17.2 (H) 4.0 - 10.5 K/uL   RBC 5.46 4.22 - 5.81 MIL/uL   Hemoglobin 17.5 (H) 13.0 - 17.0 g/dL   HCT 49.6 39.0 - 52.0 %   MCV 90.8 78.0 - 100.0 fL   MCH 32.1 26.0 -  34.0 pg   MCHC 35.3 30.0 - 36.0 g/dL   RDW 13.2 11.5 - 15.5 %   Platelets 307 150 - 400 K/uL  Comprehensive metabolic panel     Status: Abnormal   Collection Time: 05/18/14  8:08 PM  Result Value Ref Range   Sodium 137 135 - 145 mmol/L   Potassium 3.7 3.5 - 5.1 mmol/L   Chloride 101 96 - 112 mmol/L   CO2 24 19 -  32 mmol/L   Glucose, Bld 101 (H) 70 - 99 mg/dL   BUN 7 6 - 23 mg/dL   Creatinine, Ser 0.90 0.50 - 1.35 mg/dL   Calcium 10.2 8.4 - 10.5 mg/dL   Total Protein 7.7 6.0 - 8.3 g/dL   Albumin 4.4 3.5 - 5.2 g/dL   AST 18 0 - 37 U/L   ALT 22 0 - 53 U/L   Alkaline Phosphatase 75 39 - 117 U/L   Total Bilirubin 0.9 0.3 - 1.2 mg/dL   GFR calc non Af Amer >90 >90 mL/min   GFR calc Af Amer >90 >90 mL/min    Comment: (NOTE) The eGFR has been calculated using the CKD EPI equation. This calculation has not been validated in all clinical situations. eGFR's persistently <90 mL/min signify possible Chronic Kidney Disease.    Anion gap 12 5 - 15  Ethanol (ETOH)     Status: None   Collection Time: 05/18/14  8:08 PM  Result Value Ref Range   Alcohol, Ethyl (B) <5 0 - 9 mg/dL    Comment:        LOWEST DETECTABLE LIMIT FOR SERUM ALCOHOL IS 11 mg/dL FOR MEDICAL PURPOSES ONLY   Salicylate level     Status: None   Collection Time: 05/18/14  8:08 PM  Result Value Ref Range   Salicylate Lvl <3.6 2.8 - 20.0 mg/dL    Vitals: Blood pressure 129/76, pulse 86, temperature 97.5 F (36.4 C), temperature source Oral, resp. rate 16, height $RemoveBe'5\' 10"'qPzZhVxmd$  (1.778 m), weight 92.352 kg (203 lb 9.6 oz), SpO2 98 %.  Risk to Self: Suicidal Ideation: Yes-Currently Present Suicidal Intent: Yes-Currently Present Is patient at risk for suicide?: Yes Suicidal Plan?: Yes-Currently Present Specify Current Suicidal Plan: Hanging self Access to Means: Yes Specify Access to Suicidal Means: Any item used to hang self (e.g. sheet) What has been your use of drugs/alcohol within the last 12 months?: THC use within last  year How many times?: 4 Other Self Harm Risks: None noted Triggers for Past Attempts: Unpredictable Intentional Self Injurious Behavior: None Risk to Others: Homicidal Ideation: Yes-Currently Present Thoughts of Harm to Others: Yes-Currently Present Comment - Thoughts of Harm to Others: Pt did not disclose Current Homicidal Intent: Yes-Currently Present Current Homicidal Plan: Yes-Currently Present Describe Current Homicidal Plan: "To act on my thoughts" Access to Homicidal Means: No Identified Victim: None History of harm to others?: Yes Assessment of Violence: On admission Violent Behavior Description: Pt very verbally threatening with staff in ED, Pt reports a hx of harm to others but would not elaborate Does patient have access to weapons?: No Criminal Charges Pending?: No Does patient have a court date: No Prior Inpatient Therapy: Prior Inpatient Therapy: Yes Prior Therapy Dates: UTA Prior Therapy Facilty/Provider(s): UTA Reason for Treatment: SI Prior Outpatient Therapy: Prior Outpatient Therapy: No  Current Facility-Administered Medications  Medication Dose Route Frequency Provider Last Rate Last Dose  . acetaminophen (TYLENOL) tablet 650 mg  650 mg Oral Q4H PRN Ezequiel Essex, MD      . busPIRone (BUSPAR) tablet 10 mg  10 mg Oral TID Ezequiel Essex, MD   10 mg at 05/20/14 1525  . haloperidol (HALDOL) tablet 5 mg  5 mg Oral Q8H PRN Ezequiel Essex, MD   5 mg at 05/20/14 1525  . nicotine (NICODERM CQ - dosed in mg/24 hours) patch 21 mg  21 mg Transdermal Daily Marissa Sciacca, PA-C   21 mg at 05/20/14 1007  . ondansetron (ZOFRAN) tablet 4  mg  4 mg Oral Q8H PRN Ezequiel Essex, MD      . QUEtiapine (SEROQUEL) tablet 300 mg  300 mg Oral QHS Nat Christen, MD   300 mg at 05/19/14 2146   Current Outpatient Prescriptions  Medication Sig Dispense Refill  . busPIRone (BUSPAR) 10 MG tablet Take 10 mg by mouth 3 (three) times daily.    . haloperidol (HALDOL) 5 MG tablet Take 5 mg by  mouth 3 (three) times daily.    . QUEtiapine (SEROQUEL) 300 MG tablet Take 300 mg by mouth at bedtime.      Musculoskeletal: UTO, camera  Psychiatric Specialty Exam:     Blood pressure 129/76, pulse 86, temperature 97.5 F (36.4 C), temperature source Oral, resp. rate 16, height $RemoveBe'5\' 10"'CDrwGNZWs$  (1.778 m), weight 92.352 kg (203 lb 9.6 oz), SpO2 98 %.Body mass index is 29.21 kg/(m^2).  General Appearance: Casual  Eye Contact::  Good  Speech:  Clear and Coherent and Normal Rate  Volume:  Normal  Mood:  Anxious  Affect:  Appropriate  Thought Process:  Coherent and Goal Directed  Orientation:  Full (Time, Place, and Person)  Thought Content:  WDL  Suicidal Thoughts:  No  Homicidal Thoughts:  No  Memory:  Immediate;   Good Recent;   Good Remote;   Good  Judgement:  Fair  Insight:  Good  Psychomotor Activity:  Normal  Concentration:  Good  Recall:  Good  Fund of Knowledge:Good  Language: Good  Akathisia:  No  Handed:    AIMS (if indicated):     Assets:  Communication Skills Desire for Improvement Resilience Social Support  ADL's:  Intact  Cognition: WNL  Sleep:      Medical Decision Making: Established Problem, Stable/Improving (1)   Treatment Plan Summary: See below  Plan:  No evidence of imminent risk to self or others at present.   Patient does not meet criteria for psychiatric inpatient admission. Supportive therapy provided about ongoing stressors. Refer to IOP. Discussed crisis plan, support from social network, calling 911, coming to the Emergency Department, and calling Suicide Hotline.  Disposition:  -Discharge home with outpatient referrals [facility] TTS to assist) -Provide bus pass if necessary to get to these appointments -Have pt sign no-harm contract if able  -EDP: please write a 2 week supply of the current Haldol/Seroquel as ordered currently on chart.    Benjamine Mola, FNP-BC 05/20/2014 1:12PM      Case discussed with me as above

## 2014-05-20 NOTE — ED Provider Notes (Signed)
Assumed care of pt at shift change.  Pt initially presented with psychosis, HI/SI.  Psych evaluated pt initially and recommended inpt therapy.  On my assumption of care, psych reevaluated the pt and feel him stable for dc home.  They requested pt be given 2 weeks haldol/seroquel, however I do not feel comfortable prescribing this much medication for the patient given the fact that he was initially seen for SI, and this amount of medication could be potentially lethal if taken in overdose.  5 days of this medication were prescribed for the patient along with outpt resources.  DC home in stable condition.  Suicidal thoughts  Homicidal behavior  Aggressive behavior  Schizophrenia, unspecified type  Bipolar 1 disorder  Borderline personality disorder     Mirian MoMatthew Gentry, MD 05/20/14 1745

## 2014-05-20 NOTE — ED Notes (Addendum)
Pt sts he wants to leave with his medication. Pt made aware that he has to be re evaluated before he can leave. Pt agreed.

## 2014-05-20 NOTE — Progress Notes (Signed)
CSW continued inpatient placement for patient.  Patient continues to pend for placement:  Faxed To:  [facility]: Evette DoffingBarbara Moore: Lucrezia Europeiane Frye: Tammy   [facility]: [facility]   Denied: [facility]   At [facility]: [facility]  [place]  [facility]  [facility]  [place], [place] Disposition Social Worker [phone]

## 2014-05-20 NOTE — Discharge Instructions (Signed)
Bipolar Disorder Bipolar disorder is a mental illness. The term bipolar disorder actually is used to describe a group of disorders that all share varying degrees of emotional highs and lows that can interfere with daily functioning, such as work, school, or relationships. Bipolar disorder also can lead to drug abuse, hospitalization, and suicide. The emotional highs of bipolar disorder are periods of elation or irritability and high energy. These highs can range from a mild form (hypomania) to a severe form (mania). People experiencing episodes of hypomania may appear energetic, excitable, and highly productive. People experiencing mania may behave impulsively or erratically. They often make poor decisions. They may have difficulty sleeping. The most severe episodes of mania can involve having very distorted beliefs or perceptions about the world and seeing or hearing things that are not real (psychotic delusions and hallucinations).  The emotional lows of bipolar disorder (depression) also can range from mild to severe. Severe episodes of bipolar depression can involve psychotic delusions and hallucinations. Sometimes people with bipolar disorder experience a state of mixed mood. Symptoms of hypomania or mania and depression are both present during this mixed-mood episode. SIGNS AND SYMPTOMS There are signs and symptoms of the episodes of hypomania and mania as well as the episodes of depression. The signs and symptoms of hypomania and mania are similar but vary in severity. They include: 1. Inflated self-esteem or feeling of increased self-confidence. 2. Decreased need for sleep. 3. Unusual talkativeness (rapid or pressured speech) or the feeling of a need to keep talking. 4. Sensation of racing thoughts or constant talking, with quick shifts between topics that may or may not be related (flight of ideas). 5. Decreased ability to focus or concentrate. 6. Increased purposeful activity, such as work,  studies, or social activity, or nonproductive activity, such as pacing, squirming and fidgeting, or finger and toe tapping. 7. Impulsive behavior and use of poor judgment, resulting in high-risk activities, such as having unprotected sex or spending excessive amounts of money. Signs and symptoms of depression include the following:   Feelings of sadness, hopelessness, or helplessness.  Frequent or uncontrollable episodes of crying.  Lack of feeling anything or caring about anything.  Difficulty sleeping or sleeping too much.  Inability to enjoy the things you used to enjoy.   Desire to be alone all the time.   Feelings of guilt or worthlessness.  Lack of energy or motivation.   Difficulty concentrating, remembering, or making decisions.  Change in appetite or weight beyond normal fluctuations.  Thoughts of death or the desire to harm yourself. DIAGNOSIS  Bipolar disorder is diagnosed through an assessment by your caregiver. Your caregiver will ask questions about your emotional episodes. There are two main types of bipolar disorder. People with type I bipolar disorder have manic episodes with or without depressive episodes. People with type II bipolar disorder have hypomanic episodes and major depressive episodes, which are more serious than mild depression. The type of bipolar disorder you have can make an important difference in how your illness is monitored and treated. Your caregiver may ask questions about your medical history and use of alcohol or drugs, including prescription medication. Certain medical conditions and substances also can cause emotional highs and lows that resemble bipolar disorder (secondary bipolar disorder).  TREATMENT  Bipolar disorder is a long-term illness. It is best controlled with continuous treatment rather than treatment only when symptoms occur. The following treatments can be prescribed for bipolar disorders:  Medication--Medication can be  prescribed by a doctor that  is an expert in treating mental disorders (psychiatrists). Medications called mood stabilizers are usually prescribed to help control the illness. Other medications are sometimes added if symptoms of mania, depression, or psychotic delusions and hallucinations occur despite the use of a mood stabilizer.  Talk therapy--Some forms of talk therapy are helpful in providing support, education, and guidance. A combination of medication and talk therapy is best for managing the disorder over time. A procedure in which electricity is applied to your brain through your scalp (electroconvulsive therapy) is used in cases of severe mania when medication and talk therapy do not work or work too slowly. Document Released: 05/19/2000 Document Revised: 06/07/2012 Document Reviewed: 03/08/2012 [facility] Patient Information 2015 [place], [place]. This information is not intended to replace advice given to you by your health care provider. Make sure you discuss any questions you have with your health care provider.  Depression Depression refers to feeling sad, low, down in the dumps, blue, gloomy, or empty. In general, there are two kinds of depression: 8. Normal sadness or normal grief. This kind of depression is one that we all feel from time to time after upsetting life experiences, such as the loss of a job or the ending of a relationship. This kind of depression is considered normal, is short lived, and resolves within a few days to 2 weeks. Depression experienced after the loss of a loved one (bereavement) often lasts longer than 2 weeks but normally gets better with time. 9. Clinical depression. This kind of depression lasts longer than normal sadness or normal grief or interferes with your ability to function at home, at work, and in school. It also interferes with your personal relationships. It affects almost every aspect of your life. Clinical depression is an illness. Symptoms of  depression can also be caused by conditions other than those mentioned above, such as:  Physical illness. Some physical illnesses, including underactive thyroid gland (hypothyroidism), severe anemia, specific types of cancer, diabetes, uncontrolled seizures, heart and lung problems, strokes, and chronic pain are commonly associated with symptoms of depression.  Side effects of some prescription medicine. In some people, certain types of medicine can cause symptoms of depression.  Substance abuse. Abuse of alcohol and illicit drugs can cause symptoms of depression. SYMPTOMS Symptoms of normal sadness and normal grief include the following:  Feeling sad or crying for short periods of time.  Not caring about anything (apathy).  Difficulty sleeping or sleeping too much.  No longer able to enjoy the things you used to enjoy.  Desire to be by oneself all the time (social isolation).  Lack of energy or motivation.  Difficulty concentrating or remembering.  Change in appetite or weight.  Restlessness or agitation. Symptoms of clinical depression include the same symptoms of normal sadness or normal grief and also the following symptoms:  Feeling sad or crying all the time.  Feelings of guilt or worthlessness.  Feelings of hopelessness or helplessness.  Thoughts of suicide or the desire to harm yourself (suicidal ideation).  Loss of touch with reality (psychotic symptoms). Seeing or hearing things that are not real (hallucinations) or having false beliefs about your life or the people around you (delusions and paranoia). DIAGNOSIS  The diagnosis of clinical depression is usually based on how bad the symptoms are and how long they have lasted. Your health care provider will also ask you questions about your medical history and substance use to find out if physical illness, use of prescription medicine, or substance abuse is  causing your depression. Your health care provider may also  order blood tests. TREATMENT  Often, normal sadness and normal grief do not require treatment. However, sometimes antidepressant medicine is given for bereavement to ease the depressive symptoms until they resolve. The treatment for clinical depression depends on how bad the symptoms are but often includes antidepressant medicine, counseling with a mental health professional, or both. Your health care provider will help to determine what treatment is best for you. Depression caused by physical illness usually goes away with appropriate medical treatment of the illness. If prescription medicine is causing depression, talk with your health care provider about stopping the medicine, decreasing the dose, or changing to another medicine. Depression caused by the abuse of alcohol or illicit drugs goes away when you stop using these substances. Some adults need professional help in order to stop drinking or using drugs. SEEK IMMEDIATE MEDICAL CARE IF:  You have thoughts about hurting yourself or others.  You lose touch with reality (have psychotic symptoms).  You are taking medicine for depression and have a serious side effect. FOR MORE INFORMATION  National Alliance on Mental Illness: www.nami.[employer] of Mental Health: http://www.maynard.net/ Document Released: 02/08/2000 Document Revised: 06/27/2013 Document Reviewed: 05/12/2011 [facility] Patient Information 2015 [place], [place]. This information is not intended to replace advice given to you by your health care provider. Make sure you discuss any questions you have with your health care provider.

## 2014-05-20 NOTE — ED Notes (Signed)
TTS in process 

## 2014-05-20 NOTE — ED Notes (Signed)
Pt refused to have d/c VS taken.

## 2016-01-03 IMAGING — CR DG CHEST 1V PORT
1 series · 1 of 1 positions shown · non-contrast
Comparison: None.

CLINICAL DATA: Medical clearance, psychiatric evaluation. Smoking
history.

EXAM:
PORTABLE CHEST - 1 VIEW

[AP]
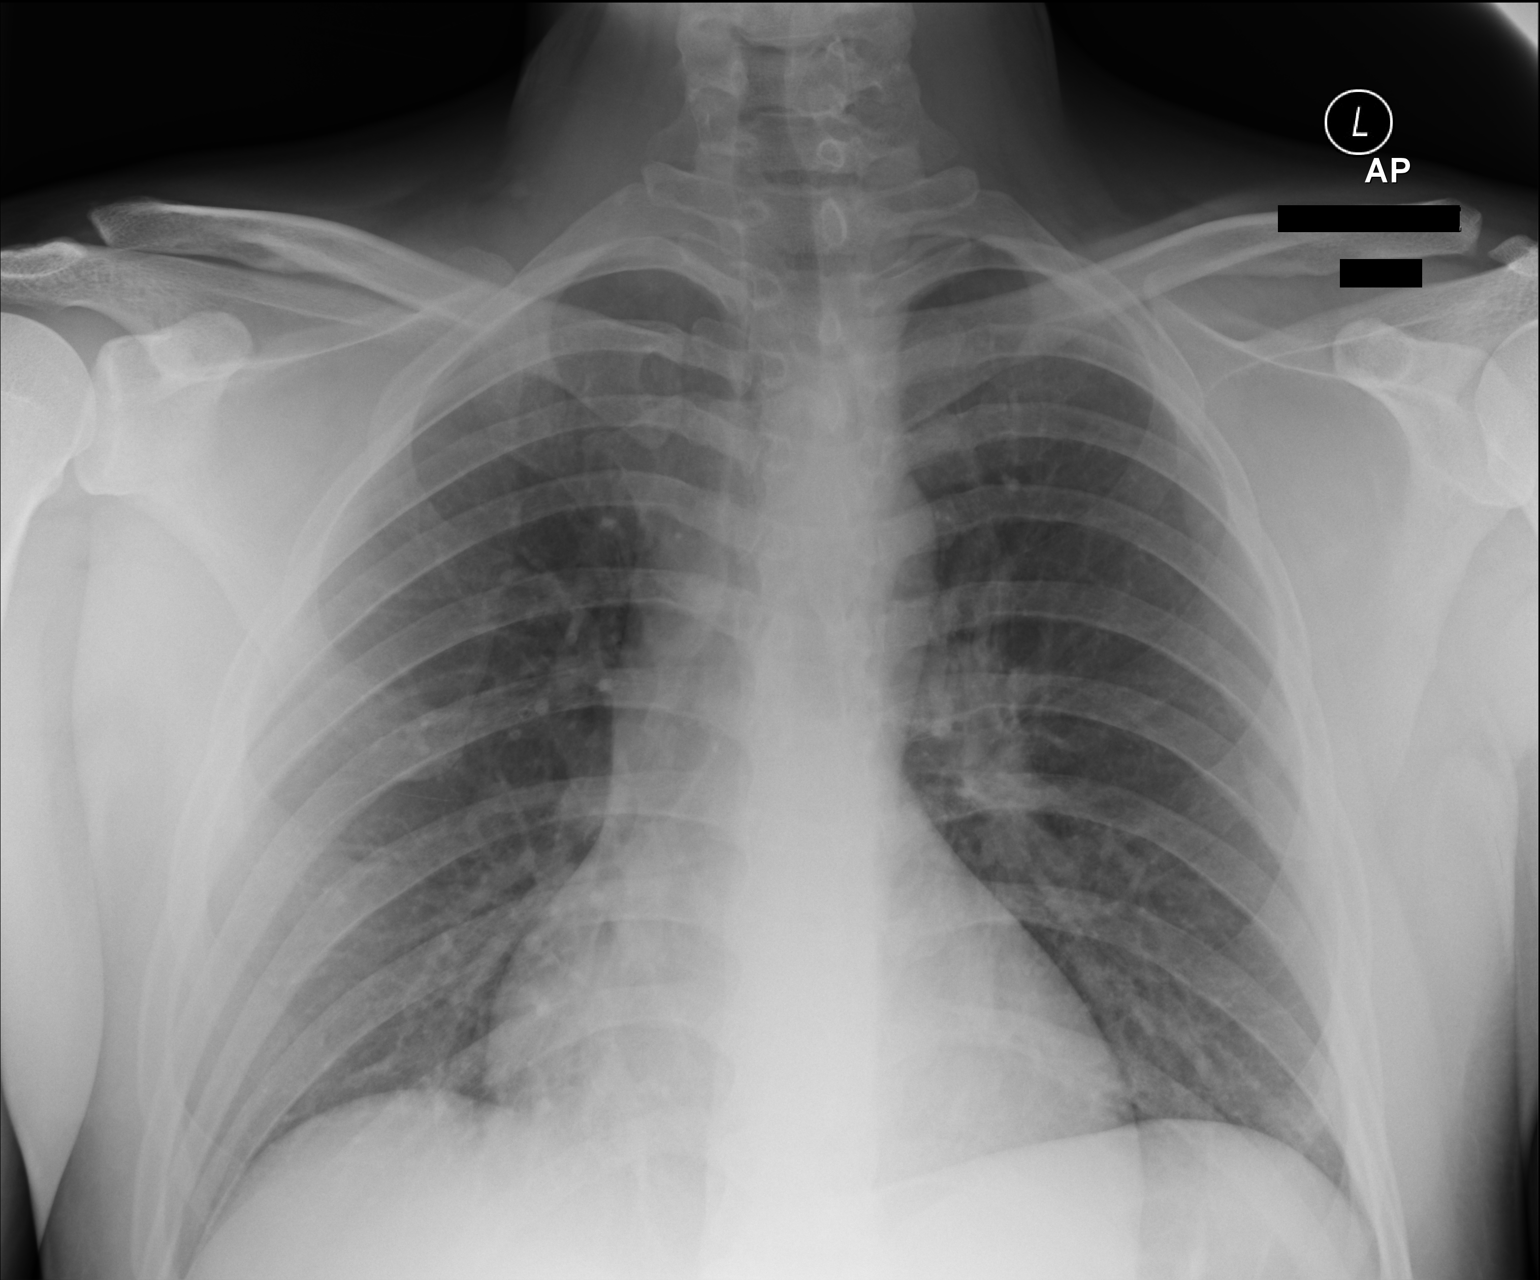

[1 of 1 positions shown; findings below may reference images not displayed]

FINDINGS: The cardiomediastinal contours are normal. The lungs are clear.
Pulmonary vasculature is normal. No consolidation, pleural effusion,
or pneumothorax. No acute osseous abnormalities are seen.
IMPRESSION: No acute pulmonary process.

## 2017-12-30 ENCOUNTER — Inpatient Hospital Stay
Admit: 2017-12-30 | Discharge: 2017-12-30 | Disposition: A | Discharge status: Home / Self Care | Payer: Self-pay | Attending: Emergency Medicine

## 2017-12-30 DIAGNOSIS — L739 Follicular disorder, unspecified: Secondary | ICD-10-CM

## 2017-12-30 MED ORDER — MUPIROCIN 2 % TOPICAL CREAM
2 % | Freq: Two times a day (BID) | CUTANEOUS | 0 refills | Status: AC
Start: 2017-12-30 — End: 2018-01-06

## 2017-12-30 MED ORDER — TRIMETHOPRIM-SULFAMETHOXAZOLE 160 MG-800 MG TAB
160-800 mg | ORAL_TABLET | Freq: Two times a day (BID) | ORAL | 0 refills | Status: AC
Start: 2017-12-30 — End: 2018-01-04

## 2017-12-30 NOTE — ED Provider Notes (Signed)
ED Provider Notes by Mel Almond, PA-C at 12/30/17 1014                Author: Zena Amos Gabriel Rung, PA-C  Service: EMERGENCY  Author Type: Physician Assistant       Filed: 12/30/17 1852  Date of Service: 12/30/17 1014  Status: Attested Addendum          Editor: Kensly Bowmer, Gabriel Rung, PA-C (Physician Assistant)       Related Notes: Original Note by Tyke Outman, Gabriel Rung, PA-C (Physician Assistant) filed at 12/30/17  1852          Cosigner: Posey Pronto, MD at 12/30/17 1856          Attestation signed by Posey Pronto, MD at 12/30/17 1856          I interviewed and examined the patient. I discussed with the mid-level provider agree with her evaluation and plan as documented here.I have reviewed and agree  with all pertinent clinical information including history, physical exam, labs, radiographic studies and the plan.  I have also reviewed and agree with the medications, allergies and past medical history sections for this patient.      Small furuncle on penis, nothing to drain.  Surrounding erythema.  Will treat with antibiotics.  Warm compresses.                                    [facility]   Emergency Department Treatment Report                Patient: Gabriel Robinson  Age: 32 y.o.  Sex: male          Date of Birth: 11-13-1985  Admit Date: 12/30/2017  PCP: Ila Mcgill, MD     MRN: [id number]   CSN: [id number]   Attending: Posey Pronto, MD         Room: 104/EO04  Time Dictated: 10:14 AM  APP: Fonnie Mu, PA-C        Chief Complaint      Chief Complaint       Patient presents with        ?  Mass             History of Present Illness     32 y.o. male  presenting for evaluation of penile bump that he had noted for 2 weeks.  Patient reports he was recently incarcerated, prior to his release he shaved the hair on his pubic area.  He noted that after shaving he developed a bump that looked like a "pimple"  to the shaft of his penis.  This is not painful, he tried  squeezing it and no pus would come out.  2 days ago he used a small knife to make an incision in the area.  He states that some pus like drainage came out.  Since then he has noted some increased  redness and discomfort in the area, is concerned it is infected.  He reports after incarceration he has been tested for STDs and was negative.  Denies penile discharge, denies dysuria.  Denies fevers or chills, no nausea or vomiting.  Denies testicular  pain or swelling.        Review of Systems     Review of Systems    Constitutional: Negative for chills and fever.    HENT: Negative for sore throat.  Gastrointestinal: Negative for abdominal pain, nausea and vomiting.    Genitourinary: Negative for dysuria.         Denies penile discharge, denies testicular pain or swelling    Skin:         "pimple" on penile shaft after shaving            Past Medical/Surgical History          Past Medical History:        Diagnosis  Date         ?  Heroin addiction (HCC)       ?  Paranoid schizophrenia (HCC)       ?  Psychiatric disorder           ?  Sciatic nerve pain                Past Surgical History:         Procedure  Laterality  Date          ?  HX ORTHOPAEDIC              right knee             Social History          Social History          Socioeconomic History         ?  Marital status:  SINGLE              Spouse name:  Not on file         ?  Number of children:  Not on file     ?  Years of education:  Not on file     ?  Highest education level:  Not on file       Occupational History        ?  Not on file       Social Needs         ?  Financial resource strain:  Not on file        ?  Food insecurity:              Worry:  Not on file         Inability:  Not on file        ?  Transportation needs:              Medical:  Not on file         Non-medical:  Not on file       Tobacco Use         ?  Smoking status:  Current Every Day Smoker              Packs/day:  3.00         Years:  16.00         Pack years:  48.00         ?   Smokeless tobacco:  Never Used       Substance and Sexual Activity         ?  Alcohol use:  Yes             Comment: occassionally         ?  Drug use:  Yes              Types:  Marijuana, Heroin         ?  Sexual activity:  Yes  Partners:  Female       Lifestyle        ?  Physical activity:              Days per week:  Not on file         Minutes per session:  Not on file         ?  Stress:  Not on file       Relationships        ?  Social connections:              Talks on phone:  Not on file         Gets together:  Not on file         Attends religious service:  Not on file         Active member of club or organization:  Not on file         Attends meetings of clubs or organizations:  Not on file         Relationship status:  Not on file        ?  Intimate partner violence:              Fear of current or ex partner:  Not on file         Emotionally abused:  Not on file         Physically abused:  Not on file         Forced sexual activity:  Not on file        Other Topics  Concern        ?  Not on file       Social History Narrative        ?  Not on file             Family History     History reviewed. No pertinent family history.        Current Medications        (Not in a hospital admission)     None          Allergies     No Known Allergies        Physical Exam          ED Triage Vitals [12/30/17 0926]     ED Encounter Vitals Group           BP  134/88        Pulse (Heart Rate)  72        Resp Rate  16        Temp  98.1 ??F (36.7 ??C)        Temp src          O2 Sat (%)  98 %        Weight  229 lb 15 oz           Height  5\' 10"         Physical Exam    Constitutional:   Well appearing male sitting up on the stretcher no acute distress    HENT:    Mouth/Throat: Oropharynx is clear and moist.    Eyes: Conjunctivae are normal.    Neck: Neck supple.    Cardiovascular: Normal rate.    Pulmonary/Chest: Effort normal.   Genitourinary:   Genitourinary Comments: Normal-appearing male circumcised genitalia, on  the left mid shaft there is what looks like a tiny pinpoint pustule, no palpable  fluctuance or abscess, some surrounding erythema and tenderness, no ulcerations  RN, Nedra Hai, at bedside as chaperone   Musculoskeletal: Normal range of motion.   Neurological: He is alert.    Skin: Skin is warm and dry.    Nursing note and vitals reviewed.            Impression and Management Plan     32 year old male presenting with bump to the shaft of the left side of penis for 2 weeks after shaving.  On arrival patient appears well.  On exam it does appear  he has what looks like a folliculitis from shaving.  Does not seem to have abscess requiring drainage or incision at this point.        Diagnostic Studies     Lab:    No results found for this or any previous visit (from the past 12 hour(s)).   Labs Reviewed - No data to display      Imaging:     No results found.        ED Course/Medical Decision Making     Discussed with patient will need oral as well as topical antibiotics.  Will give prescription for Bactrim as well as topical Bactroban.  At this point does not appear  to be ulceration consistent with herpes or other STD.  We did recommend that he follow-up with primary care physician as soon as possible.  Should return here for new or worsening symptoms such as worsening redness, swelling, fevers pus or other concerns.   Patient expressed understanding, he is in agreement with this plan.          Medications - No data to display     Final Diagnosis                 ICD-10-CM  ICD-9-CM          1.  Folliculitis  L73.9  704.8             Disposition     Home in stable condition           Discharge Medication List as of 12/30/2017 10:46 AM              START taking these medications          Details        trimethoprim-sulfamethoxazole (BACTRIM DS) 160-800 mg per tablet  Take 1 Tab by mouth two (2) times a day for 5 days., Print, Disp-10 Tab, R-0               mupirocin calcium (BACTROBAN) 2 % topical cream  Apply  to affected area  two (2) times a day for 7 days., Print, Disp-15 g, R-0                      Fonnie Mu, PA-C   December 30, 2017   10:14 AM      The patient was personally evaluated by myself and Dr. Truddie Crumble, Jonelle Sports, MD who agrees with the above assessment and plan.   Please note that portions of this note were completed with a voice recognition program. Efforts were made to edit the dictations but occasionally words are mis-transcribed.    My signature above authenticates this document and my orders, the final ??   diagnosis (es), discharge prescription (s), and instructions in the Epic ??   record.   If you have any questions please contact [phone].   ??  Nursing notes have been reviewed by the physician/ advanced practice ??   Clinician.

## 2017-12-30 NOTE — ED Notes (Signed)
States he has a bump on his penis x 2 weeks, very tender

## 2017-12-30 NOTE — ED Notes (Signed)
10:58 AM  12/30/17     Discharge instructions given to  Mr Pollio (name) with verbalization of understanding. Patient accompanied by self.  Patient discharged with the following prescriptions bactroban, bactrim. Patient discharged to home (destination).      Loralyn Freshwater

## 2017-12-30 NOTE — ED Triage Notes (Signed)
States he has a bump on his penis x 2 weeks, very tender

## 2017-12-30 NOTE — ED Notes (Signed)
10:58 AM  12/30/17     Discharge instructions given to  Mr Kane (name) with verbalization of understanding. Patient accompanied by self.  Patient discharged with the following prescriptions bactroban, bactrim. Patient discharged to home (destination).      Loralyn Freshwater

## 2017-12-30 NOTE — ED Provider Notes (Addendum)
[facility]  Emergency Department Treatment Report        Patient: Gabriel Robinson Age: 32 y.o. Sex: male    Date of Birth: May 18, 1985 Admit Date: 12/30/2017 PCP: Ila Mcgill, MD   MRN: [id number]  CSN: [id number]  Attending: Posey Pronto, MD   Room: 104/EO04 Time Dictated: 10:14 AM APP: Fonnie Mu, PA-C     Chief Complaint   Chief Complaint   Patient presents with   ??? Mass       History of Present Illness   32 y.o. male presenting for evaluation of penile bump that he had noted for 2 weeks.  Patient reports he was recently incarcerated, prior to his release he shaved the hair on his pubic area.  He noted that after shaving he developed a bump that looked like a "pimple" to the shaft of his penis.  This is not painful, he tried squeezing it and no pus would come out.  2 days ago he used a small knife to make an incision in the area.  He states that some pus like drainage came out.  Since then he has noted some increased redness and discomfort in the area, is concerned it is infected.  He reports after incarceration he has been tested for STDs and was negative.  Denies penile discharge, denies dysuria.  Denies fevers or chills, no nausea or vomiting.  Denies testicular pain or swelling.    Review of Systems   Review of Systems   Constitutional: Negative for chills and fever.   HENT: Negative for sore throat.    Gastrointestinal: Negative for abdominal pain, nausea and vomiting.   Genitourinary: Negative for dysuria.        Denies penile discharge, denies testicular pain or swelling   Skin:        "pimple" on penile shaft after shaving       Past Medical/Surgical History     Past Medical History:   Diagnosis Date   ??? Heroin addiction (HCC)    ??? Paranoid schizophrenia (HCC)    ??? Psychiatric disorder    ??? Sciatic nerve pain         Past Surgical History:   Procedure Laterality Date   ??? HX ORTHOPAEDIC      right knee       Social History     Social History     Socioeconomic History    ??? Marital status: SINGLE     Spouse name: Not on file   ??? Number of children: Not on file   ??? Years of education: Not on file   ??? Highest education level: Not on file   Occupational History   ??? Not on file   Social Needs   ??? Financial resource strain: Not on file   ??? Food insecurity:     Worry: Not on file     Inability: Not on file   ??? Transportation needs:     Medical: Not on file     Non-medical: Not on file   Tobacco Use   ??? Smoking status: Current Every Day Smoker     Packs/day: 3.00     Years: 16.00     Pack years: 48.00   ??? Smokeless tobacco: Never Used   Substance and Sexual Activity   ??? Alcohol use: Yes     Comment: occassionally   ??? Drug use: Yes     Types: Marijuana, Heroin   ??? Sexual activity: Yes  Partners: Female   Lifestyle   ??? Physical activity:     Days per week: Not on file     Minutes per session: Not on file   ??? Stress: Not on file   Relationships   ??? Social connections:     Talks on phone: Not on file     Gets together: Not on file     Attends religious service: Not on file     Active member of club or organization: Not on file     Attends meetings of clubs or organizations: Not on file     Relationship status: Not on file   ??? Intimate partner violence:     Fear of current or ex partner: Not on file     Emotionally abused: Not on file     Physically abused: Not on file     Forced sexual activity: Not on file   Other Topics Concern   ??? Not on file   Social History Narrative   ??? Not on file       Family History   History reviewed. No pertinent family history.    Current Medications     (Not in a hospital admission)  None     Allergies   No Known Allergies    Physical Exam     ED Triage Vitals [12/30/17 0926]   ED Encounter Vitals Group      BP 134/88      Pulse (Heart Rate) 72      Resp Rate 16      Temp 98.1 ??F (36.7 ??C)      Temp src       O2 Sat (%) 98 %      Weight 229 lb 15 oz      Height 5\' 10"      Physical Exam   Constitutional:    Well appearing male sitting up on the stretcher no acute distress   HENT:   Mouth/Throat: Oropharynx is clear and moist.   Eyes: Conjunctivae are normal.   Neck: Neck supple.   Cardiovascular: Normal rate.   Pulmonary/Chest: Effort normal.   Genitourinary:   Genitourinary Comments: Normal-appearing male circumcised genitalia, on the left mid shaft there is what looks like a tiny pinpoint pustule, no palpable fluctuance or abscess, some surrounding erythema and tenderness, no ulcerations  RN, Nedra Hai, at bedside as chaperone   Musculoskeletal: Normal range of motion.   Neurological: He is alert.   Skin: Skin is warm and dry.   Nursing note and vitals reviewed.       Impression and Management Plan   32 year old male presenting with bump to the shaft of the left side of penis for 2 weeks after shaving.  On arrival patient appears well.  On exam it does appear he has what looks like a folliculitis from shaving.  Does not seem to have abscess requiring drainage or incision at this point.    Diagnostic Studies   Lab:   No results found for this or any previous visit (from the past 12 hour(s)).  Labs Reviewed - No data to display    Imaging:    No results found.    ED Course/Medical Decision Making   Discussed with patient will need oral as well as topical antibiotics.  Will give prescription for Bactrim as well as topical Bactroban.  At this point does not appear to be ulceration consistent with herpes or other STD.  We did recommend that he follow-up with  primary care physician as soon as possible.  Should return here for new or worsening symptoms such as worsening redness, swelling, fevers pus or other concerns.  Patient expressed understanding, he is in agreement with this plan.       Medications - No data to display  Final Diagnosis       ICD-10-CM ICD-9-CM   1. Folliculitis L73.9 704.8       Disposition   Home in stable condition      Discharge Medication List as of 12/30/2017 10:46 AM       START taking these medications    Details   trimethoprim-sulfamethoxazole (BACTRIM DS) 160-800 mg per tablet Take 1 Tab by mouth two (2) times a day for 5 days., Print, Disp-10 Tab, R-0      mupirocin calcium (BACTROBAN) 2 % topical cream Apply  to affected area two (2) times a day for 7 days., Print, Disp-15 g, R-0           Fonnie Mu, PA-C  December 30, 2017  10:14 AM    The patient was personally evaluated by myself and Dr. Truddie Crumble, Jonelle Sports, MD who agrees with the above assessment and plan.  Please note that portions of this note were completed with a voice recognition program. Efforts were made to edit the dictations but occasionally words are mis-transcribed.   My signature above authenticates this document and my orders, the final ??  diagnosis (es), discharge prescription (s), and instructions in the Epic ??  record.  If you have any questions please contact [phone].  ??  Nursing notes have been reviewed by the physician/ advanced practice ??  Clinician.

## 2019-01-13 ENCOUNTER — Inpatient Hospital Stay: Admit: 2019-01-13 | Discharge: 2019-01-13 | Payer: MEDICAID | Attending: Emergency Medicine

## 2019-01-13 DIAGNOSIS — R45851 Suicidal ideations: Secondary | ICD-10-CM

## 2019-01-13 LAB — SARS-COV-2: COVID-19 rapid test: NOT DETECTED

## 2019-01-13 LAB — METABOLIC PANEL, BASIC
Anion gap: 2 mmol/L — ABNORMAL LOW (ref 3.0–18)
BUN/Creatinine ratio: 9 — ABNORMAL LOW (ref 12–20)
BUN: 9 MG/DL (ref 7.0–18)
CO2: 29 mmol/L (ref 21–32)
Calcium: 9.8 MG/DL (ref 8.5–10.1)
Chloride: 107 mmol/L (ref 100–111)
Creatinine: 0.98 MG/DL (ref 0.6–1.3)
GFR est AA: >60 mL/min/{1.73_m2} (ref >60)
GFR est non-AA: >60 mL/min/{1.73_m2} (ref >60)
Glucose: 100 mg/dL — ABNORMAL HIGH (ref 74–99)
Potassium: 3.8 mmol/L (ref 3.5–5.5)
Sodium: 138 mmol/L (ref 136–145)

## 2019-01-13 LAB — CBC WITH AUTOMATED DIFF
ABS. BASOPHILS: 0 10*3/uL (ref 0.0–0.1)
ABS. EOSINOPHILS: 0.1 10*3/uL (ref 0.0–0.4)
ABS. LYMPHOCYTES: 2 10*3/uL (ref 0.9–3.6)
ABS. MONOCYTES: 0.9 10*3/uL (ref 0.05–1.2)
ABS. NEUTROPHILS: 11.1 10*3/uL — ABNORMAL HIGH (ref 1.8–8.0)
BASOPHILS: 0 % (ref 0–2)
EOSINOPHILS: 1 % (ref 0–5)
HCT: 50.3 % — ABNORMAL HIGH (ref 36.0–48.0)
HGB: 17 g/dL — ABNORMAL HIGH (ref 13.0–16.0)
LYMPHOCYTES: 14 % — ABNORMAL LOW (ref 21–52)
MCH: 30.4 PG (ref 24.0–34.0)
MCHC: 33.8 g/dL (ref 31.0–37.0)
MCV: 89.8 FL (ref 74.0–97.0)
MONOCYTES: 6 % (ref 3–10)
MPV: 10.1 FL (ref 9.2–11.8)
NEUTROPHILS: 79 % — ABNORMAL HIGH (ref 40–73)
PLATELET: 295 10*3/uL (ref 135–420)
RBC: 5.6 M/uL — ABNORMAL HIGH (ref 4.70–5.50)
RDW: 13.5 % (ref 11.6–14.5)
WBC: 14.1 10*3/uL — ABNORMAL HIGH (ref 4.6–13.2)

## 2019-01-13 LAB — DRUG SCREEN, URINE
AMPHETAMINES: POSITIVE — AB
Amphetamine Screen, Urine: POSITIVE — AB
BARBITURATES: NEGATIVE
BENZODIAZEPINES: NEGATIVE
Barbiturate Screen, Urine: NEGATIVE
Benzodiazepine Screen, Urine: NEGATIVE
COCAINE: NEGATIVE
Cocaine Screen Urine: NEGATIVE
METHADONE: NEGATIVE
Methadone Screen, Urine: NEGATIVE
OPIATES: POSITIVE — AB
Opiate Screen, Urine: POSITIVE — AB
PCP Screen, Urine: NEGATIVE
PCP(PHENCYCLIDINE): NEGATIVE
THC (TH-CANNABINOL): POSITIVE — AB
THC Screen, Urine: POSITIVE — AB

## 2019-01-13 LAB — ETHYL ALCOHOL
ALCOHOL(ETHYL),SERUM: <3 MG/DL (ref 0–3)
Ethyl Alcohol: <3 MG/DL (ref 0–3)

## 2019-01-13 LAB — CBC WITH AUTO DIFFERENTIAL
Basophils %: 0 % (ref 0–2)
Basophils Absolute: 0 10*3/uL (ref 0.0–0.1)
Eosinophils %: 1 % (ref 0–5)
Eosinophils Absolute: 0.1 10*3/uL (ref 0.0–0.4)
Hematocrit: 50.3 % — ABNORMAL HIGH (ref 36.0–48.0)
Hemoglobin: 17 g/dL — ABNORMAL HIGH (ref 13.0–16.0)
Lymphocytes %: 14 % — ABNORMAL LOW (ref 21–52)
Lymphocytes Absolute: 2 10*3/uL (ref 0.9–3.6)
MCH: 30.4 PG (ref 24.0–34.0)
MCHC: 33.8 g/dL (ref 31.0–37.0)
MCV: 89.8 FL (ref 74.0–97.0)
MPV: 10.1 FL (ref 9.2–11.8)
Monocytes %: 6 % (ref 3–10)
Monocytes Absolute: 0.9 10*3/uL (ref 0.05–1.2)
Neutrophils %: 79 % — ABNORMAL HIGH (ref 40–73)
Neutrophils Absolute: 11.1 10*3/uL — ABNORMAL HIGH (ref 1.8–8.0)
Platelets: 295 10*3/uL (ref 135–420)
RBC: 5.6 M/uL — ABNORMAL HIGH (ref 4.70–5.50)
RDW: 13.5 % (ref 11.6–14.5)
WBC: 14.1 10*3/uL — ABNORMAL HIGH (ref 4.6–13.2)

## 2019-01-13 LAB — BASIC METABOLIC PANEL
Anion Gap: 2 mmol/L — ABNORMAL LOW (ref 3.0–18)
BUN: 9 MG/DL (ref 7.0–18)
Bun/Cre Ratio: 9 — ABNORMAL LOW (ref 12–20)
CO2: 29 mmol/L (ref 21–32)
Calcium: 9.8 MG/DL (ref 8.5–10.1)
Chloride: 107 mmol/L (ref 100–111)
Creatinine: 0.98 MG/DL (ref 0.6–1.3)
EGFR IF NonAfrican American: >60 mL/min/{1.73_m2} (ref >60)
GFR African American: >60 mL/min/{1.73_m2} (ref >60)
Glucose: 100 mg/dL — ABNORMAL HIGH (ref 74–99)
Potassium: 3.8 mmol/L (ref 3.5–5.5)
Sodium: 138 mmol/L (ref 136–145)

## 2019-01-13 LAB — COVID-19: SARS-CoV-2, Rapid: NOT DETECTED

## 2019-01-13 MED ORDER — RISPERIDONE 1 MG TAB, RAPID DISSOLVE
1 mg | ORAL | Status: AC
Start: 2019-01-13 — End: 2019-01-13
  Administered 2019-01-13: 16:00:00 via ORAL

## 2019-01-13 MED FILL — RISPERIDONE 1 MG TAB, RAPID DISSOLVE: 1 mg | ORAL | Qty: 2

## 2019-01-13 NOTE — ED Notes (Signed)
Received report and care of pt from Kym, RN. Meal tray brought to bedside.   Pt sitting up in bed, tearful, and reports his brain is too advanced, there is a constant struggle between good and evil, and he can see things before it happens and it's mostly bad things he sees.  Pt does not want to describe what he sees and starts crying when he beings to speak about the voices he hears and visions he sees.  Pt reports SI with a plan and has attempted before. Pt sitting up in bed and ambulated into hallway.  Provider in hallway with patient and updated with plan of care.  Orders received.  Charge nurse updated RN with plan of care and pt updated.  Pt laying in bed in no acute distress with unlabored breathing.

## 2019-01-13 NOTE — ED Notes (Signed)
Called [facility] answering service to page the on call person for an update.

## 2019-01-13 NOTE — ED Provider Notes (Signed)
EMERGENCY DEPARTMENT HISTORY AND PHYSICAL EXAM    3:30 AM      Date: 01/13/2019  Patient Name: Gabriel Robinson    History of Presenting Illness     Chief Complaint   Patient presents with   ??? Mental Health Problem         History Provided By: Patient    Additional History (Context): Gabriel Robinson is a 33 y.o. male with No significant past medical history who presents with suicidal and homicidal ideation.  Patient states he is hearing voices that are telling him to hang himself.  He admits to hearing voices in the past.  He denies any fever, cough, running or diarrhea.  Patient states he smokes 3 pack/day.  He denies any alcohol use.  He does admit to daily marijuana use.    PCP: Ila Mcgill, MD          Past History     Past Medical History:  Past Medical History:   Diagnosis Date   ??? Heroin addiction (HCC)    ??? Paranoid schizophrenia (HCC)    ??? Psychiatric disorder    ??? Sciatic nerve pain        Past Surgical History:  Past Surgical History:   Procedure Laterality Date   ??? HX ORTHOPAEDIC      right knee       Family History:  No family history on file.    Social History:  Social History     Tobacco Use   ??? Smoking status: Current Every Day Smoker     Packs/day: 3.00     Years: 16.00     Pack years: 48.00   ??? Smokeless tobacco: Never Used   Substance Use Topics   ??? Alcohol use: Yes     Comment: occassionally   ??? Drug use: Yes     Types: Marijuana, Heroin       Allergies:  No Known Allergies      Review of Systems       Review of Systems   Constitutional: Negative.  Negative for chills, diaphoresis and fever.   HENT: Negative.  Negative for congestion, rhinorrhea and sore throat.    Eyes: Negative.  Negative for pain, discharge and redness.   Respiratory: Negative.  Negative for cough, chest tightness, shortness of breath and wheezing.    Cardiovascular: Negative.  Negative for chest pain.   Gastrointestinal: Negative.  Negative for abdominal pain, constipation, diarrhea, nausea and vomiting.   Genitourinary:  Negative.  Negative for dysuria, flank pain, frequency, hematuria and urgency.   Musculoskeletal: Negative.  Negative for back pain and neck pain.   Skin: Negative.  Negative for rash.   Neurological: Negative.  Negative for syncope, weakness, numbness and headaches.   Psychiatric/Behavioral: Negative.    All other systems reviewed and are negative.        Physical Exam     Visit Vitals  BP (!) 143/99 (BP Patient Position: Sitting)   Pulse (!) 103   Temp 99.1 ??F (37.3 ??C)   Resp 19   SpO2 98%         Physical Exam  Constitutional:       General: He is not in acute distress.     Appearance: Normal appearance. He is well-developed. He is not ill-appearing, toxic-appearing or diaphoretic.   HENT:      Head: Normocephalic and atraumatic.      Mouth/Throat:      Pharynx: No oropharyngeal exudate.  Eyes:      General: No scleral icterus.     Conjunctiva/sclera: Conjunctivae normal.      Pupils: Pupils are equal, round, and reactive to light.   Neck:      Musculoskeletal: Normal range of motion and neck supple.      Thyroid: No thyromegaly.      Vascular: No hepatojugular reflux or JVD.      Trachea: No tracheal deviation.   Cardiovascular:      Rate and Rhythm: Normal rate and regular rhythm.      Pulses: Normal pulses.           Radial pulses are 2+ on the right side and 2+ on the left side.        Dorsalis pedis pulses are 2+ on the right side and 2+ on the left side.      Heart sounds: Normal heart sounds, S1 normal and S2 normal. No murmur. No gallop. No S3 or S4 sounds.    Pulmonary:      Effort: Pulmonary effort is normal. No respiratory distress.      Breath sounds: Normal breath sounds. No decreased breath sounds, wheezing, rhonchi or rales.   Abdominal:      General: Bowel sounds are normal. There is no distension.      Palpations: Abdomen is soft. Abdomen is not rigid. There is no mass.      Tenderness: There is no abdominal tenderness. There is no guarding or rebound. Negative signs include Murphy's sign and  McBurney's sign.   Musculoskeletal: Normal range of motion.   Lymphadenopathy:      Head:      Right side of head: No submental, submandibular, preauricular or occipital adenopathy.      Left side of head: No submental, submandibular, preauricular or occipital adenopathy.      Cervical: No cervical adenopathy.      Upper Body:      Right upper body: No supraclavicular adenopathy.      Left upper body: No supraclavicular adenopathy.   Skin:     General: Skin is warm and dry.      Findings: No rash.   Neurological:      Mental Status: He is alert. He is not disoriented.      GCS: GCS eye subscore is 4. GCS verbal subscore is 5. GCS motor subscore is 6.      Cranial Nerves: No cranial nerve deficit.      Sensory: No sensory deficit.      Coordination: Coordination normal.      Gait: Gait normal.      Deep Tendon Reflexes: Reflexes are normal and symmetric.   Psychiatric:         Attention and Perception: He is inattentive.         Mood and Affect: Mood is depressed.         Speech: Speech normal.         Behavior: Behavior is withdrawn.         Thought Content: Thought content includes homicidal and suicidal ideation. Thought content includes suicidal plan.         Judgment: Judgment normal.           Diagnostic Study Results     Labs -  Recent Results (from the past 12 hour(s))   ETHYL ALCOHOL    Collection Time: 01/13/19  3:06 AM   Result Value Ref Range    ALCOHOL(ETHYL),SERUM <3 0 - 3 MG/DL  CBC WITH AUTOMATED DIFF    Collection Time: 01/13/19  3:06 AM   Result Value Ref Range    WBC 14.1 (H) 4.6 - 13.2 K/uL    RBC 5.60 (H) 4.70 - 5.50 M/uL    HGB 17.0 (H) 13.0 - 16.0 g/dL    HCT 16.150.3 (H) 09.636.0 - 48.0 %    MCV 89.8 74.0 - 97.0 FL    MCH 30.4 24.0 - 34.0 PG    MCHC 33.8 31.0 - 37.0 g/dL    RDW 04.513.5 40.911.6 - 81.114.5 %    PLATELET 295 135 - 420 K/uL    MPV 10.1 9.2 - 11.8 FL    NEUTROPHILS 79 (H) 40 - 73 %    LYMPHOCYTES 14 (L) 21 - 52 %    MONOCYTES 6 3 - 10 %    EOSINOPHILS 1 0 - 5 %    BASOPHILS 0 0 - 2 %    ABS.  NEUTROPHILS 11.1 (H) 1.8 - 8.0 K/UL    ABS. LYMPHOCYTES 2.0 0.9 - 3.6 K/UL    ABS. MONOCYTES 0.9 0.05 - 1.2 K/UL    ABS. EOSINOPHILS 0.1 0.0 - 0.4 K/UL    ABS. BASOPHILS 0.0 0.0 - 0.1 K/UL    DF AUTOMATED     METABOLIC PANEL, BASIC    Collection Time: 01/13/19  3:06 AM   Result Value Ref Range    Sodium 138 136 - 145 mmol/L    Potassium 3.8 3.5 - 5.5 mmol/L    Chloride 107 100 - 111 mmol/L    CO2 29 21 - 32 mmol/L    Anion gap 2 (L) 3.0 - 18 mmol/L    Glucose 100 (H) 74 - 99 mg/dL    BUN 9 7.0 - 18 MG/DL    Creatinine 9.140.98 0.6 - 1.3 MG/DL    BUN/Creatinine ratio 9 (L) 12 - 20      GFR est AA >60 >60 ml/min/1.2773m2    GFR est non-AA >60 >60 ml/min/1.7173m2    Calcium 9.8 8.5 - 10.1 MG/DL       Radiologic Studies -   No orders to display         Medical Decision Making   Provider Notes (Medical Decision Making):  MDM  Number of Diagnoses or Management Options  Diagnosis management comments: Suicidal ideation       I am the first provider for this patient.    I reviewed the vital signs, available nursing notes, past medical history, past surgical history, family history and social history.    Vital Signs-Reviewed the patient's vital signs.    Records Reviewed: Nursing Notes (Time of Review: 3:30 AM)    ED Course: Progress Notes, Reevaluation, and Consults:    Labs essentially normal with the exception of wbc 14.1. Etoh negative.  4:30 AM 01/13/2019    Consult:  Discussed care with Crisis Services. Standard discussion; including history of patient???s chief complaint, available diagnostic results, and treatment course. Will call psychiatrist for admission.  5:50 AM    Consult:  Discussed care with Crisis Services. Standard discussion; including history of patient???s chief complaint, available diagnostic results, and treatment course. Awaiting for crisis stabilization.  6:54 AM        Diagnosis         Clinical Impression:   1. Suicidal ideation        Disposition: Admitted       Attestation        Provider Attestation:  I  personally performed the services described in the documentation, reviewed the documentation and it accurately and completely records my words and actions utilizing the [place] January 13, 2019 at 5:52 AM - Keoni Risinger, Liberty Handy, DO    Disclaimer.  It is dictated using utilizing voice recognition software.  Unfortunately this leads to occasional typographical errors.  I apologize in advance if the situation occurs.  If questions arise please do not hesitate to contact me or call our department.

## 2019-01-13 NOTE — ED Notes (Signed)
 Patient walked outside with this nurse for his COVID test. He was very cooperative. Did ask if he could smoke a cigarette, when told No, he asked why. Informed patient that this is a non-smoking facility.

## 2019-01-13 NOTE — ED Notes (Signed)
ED Course as of Jan 12 1130   Thu Jan 13, 2019   0827 Patient reporting drug use suicidal ideation violent ideations.  Awaiting voluntary placement.    [CB]   1008 Patient pacing the hallways, pleasant and cooperative but bored and irritated at long placement process.  He did get breakfast.  He would like something to calm him down.  Says he supposed to be on Haldol.  Risperdal ordered    [CB]      ED Course User Index  [CB] Deborra Medina, MD     11:31 AM Sheriff's department has arrived with a large stack of warrants, wanting to arrest Mr. Ballweg.  He will be able to get psychiatric monitoring and care in the criminal justice system.  He will be discharged to police custody.

## 2019-01-13 NOTE — ED Notes (Signed)
Paper scrubs,  red non-slip socks, orange juice brought to bedside.

## 2019-01-13 NOTE — ED Notes (Signed)
Spoke with Marylene Land, she says [facility] did not except the patient and referred him back to Crisis.

## 2019-01-13 NOTE — ED Notes (Signed)
 Report given to Kym, RN

## 2019-01-13 NOTE — ED Notes (Signed)
 Patient presents to the ED with complaints of hallucinations. Patient states I have not been taking my medication for as long as I can remember. Patient admits to SI and HI. Patient drowsy in triage unable to hold conversation

## 2019-01-13 NOTE — ED Notes (Signed)
ED Course as of Jan 12 1130   Thu Jan 13, 2019   0827 Patient reporting drug use suicidal ideation violent ideations.  Awaiting voluntary placement.    [CB]   1008 Patient pacing the hallways, pleasant and cooperative but bored and irritated at long placement process.  He did get breakfast.  He would like something to calm him down.  Says he supposed to be on Haldol.  Risperdal ordered    [CB]      ED Course User Index  [CB] Elesa Garman I, MD     11:31 AM Sheriff's department has arrived with a large stack of warrants, wanting to arrest Mr. Medlen.  He will be able to get psychiatric monitoring and care in the criminal justice system.  He will be discharged to police custody.

## 2019-01-13 NOTE — Other (Signed)
Spoke with Marylene Land from [place], patient to acute for [facility].   Patient accepted to [facility] inpatient by Dr Nicholaus Bloom.   COVID results pending.

## 2019-01-13 NOTE — ED Notes (Signed)
Report given to Kym, RN.

## 2019-01-13 NOTE — ED Notes (Signed)
Called [facility] answering service to page the on call person for an update.

## 2019-01-13 NOTE — ED Notes (Signed)
Patient walked outside with this nurse for his COVID test. He was very cooperative. Did ask if he could smoke a cigarette, when told "No", he asked why. Informed patient that this is a non-smoking facility.

## 2019-01-13 NOTE — ED Notes (Signed)
Paper scrubs,  red non-slip socks, orange juice brought to bedside.

## 2019-01-13 NOTE — ED Provider Notes (Addendum)
EMERGENCY DEPARTMENT HISTORY AND PHYSICAL EXAM    3:30 AM      Date: 01/13/2019  Patient Name: Gabriel Robinson    History of Presenting Illness     Chief Complaint   Patient presents with   ??? Mental Health Problem         History Provided By: Patient    Additional History (Context): Gabriel Robinson is a 33 y.o. male with No significant past medical history who presents with suicidal and homicidal ideation.  Patient states he is hearing voices that are telling him to hang himself.  He admits to hearing voices in the past.  He denies any fever, cough, running or diarrhea.  Patient states he smokes 3 pack/day.  He denies any alcohol use.  He does admit to daily marijuana use.    PCP: Deatra James, MD          Past History     Past Medical History:  Past Medical History:   Diagnosis Date   ??? Heroin addiction ([place])    ??? Paranoid schizophrenia ([place])    ??? Psychiatric disorder    ??? Sciatic nerve pain        Past Surgical History:  Past Surgical History:   Procedure Laterality Date   ??? HX ORTHOPAEDIC      right knee       Family History:  No family history on file.    Social History:  Social History     Tobacco Use   ??? Smoking status: Current Every Day Smoker     Packs/day: 3.00     Years: 16.00     Pack years: 48.00   ??? Smokeless tobacco: Never Used   Substance Use Topics   ??? Alcohol use: Yes     Comment: occassionally   ??? Drug use: Yes     Types: Marijuana, Heroin       Allergies:  No Known Allergies      Review of Systems       Review of Systems   Constitutional: Negative.  Negative for chills, diaphoresis and fever.   HENT: Negative.  Negative for congestion, rhinorrhea and sore throat.    Eyes: Negative.  Negative for pain, discharge and redness.   Respiratory: Negative.  Negative for cough, chest tightness, shortness of breath and wheezing.    Cardiovascular: Negative.  Negative for chest pain.   Gastrointestinal: Negative.  Negative for abdominal pain, constipation, diarrhea, nausea and vomiting.    Genitourinary: Negative.  Negative for dysuria, flank pain, frequency, hematuria and urgency.   Musculoskeletal: Negative.  Negative for back pain and neck pain.   Skin: Negative.  Negative for rash.   Neurological: Negative.  Negative for syncope, weakness, numbness and headaches.   Psychiatric/Behavioral: Negative.    All other systems reviewed and are negative.        Physical Exam     Visit Vitals  BP (!) 143/99 (BP Patient Position: Sitting)   Pulse (!) 103   Temp 99.1 ??F (37.3 ??C)   Resp 19   SpO2 98%         Physical Exam  Constitutional:       General: He is not in acute distress.     Appearance: Normal appearance. He is well-developed. He is not ill-appearing, toxic-appearing or diaphoretic.   HENT:      Head: Normocephalic and atraumatic.      Mouth/Throat:      Pharynx: No oropharyngeal exudate.  Eyes:      General: No scleral icterus.     Conjunctiva/sclera: Conjunctivae normal.      Pupils: Pupils are equal, round, and reactive to light.   Neck:      Musculoskeletal: Normal range of motion and neck supple.      Thyroid: No thyromegaly.      Vascular: No hepatojugular reflux or JVD.      Trachea: No tracheal deviation.   Cardiovascular:      Rate and Rhythm: Normal rate and regular rhythm.      Pulses: Normal pulses.           Radial pulses are 2+ on the right side and 2+ on the left side.        Dorsalis pedis pulses are 2+ on the right side and 2+ on the left side.      Heart sounds: Normal heart sounds, S1 normal and S2 normal. No murmur. No gallop. No S3 or S4 sounds.    Pulmonary:      Effort: Pulmonary effort is normal. No respiratory distress.      Breath sounds: Normal breath sounds. No decreased breath sounds, wheezing, rhonchi or rales.   Abdominal:      General: Bowel sounds are normal. There is no distension.      Palpations: Abdomen is soft. Abdomen is not rigid. There is no mass.      Tenderness: There is no abdominal tenderness. There is no guarding or  rebound. Negative signs include Murphy's sign and McBurney's sign.   Musculoskeletal: Normal range of motion.   Lymphadenopathy:      Head:      Right side of head: No submental, submandibular, preauricular or occipital adenopathy.      Left side of head: No submental, submandibular, preauricular or occipital adenopathy.      Cervical: No cervical adenopathy.      Upper Body:      Right upper body: No supraclavicular adenopathy.      Left upper body: No supraclavicular adenopathy.   Skin:     General: Skin is warm and dry.      Findings: No rash.   Neurological:      Mental Status: He is alert. He is not disoriented.      GCS: GCS eye subscore is 4. GCS verbal subscore is 5. GCS motor subscore is 6.      Cranial Nerves: No cranial nerve deficit.      Sensory: No sensory deficit.      Coordination: Coordination normal.      Gait: Gait normal.      Deep Tendon Reflexes: Reflexes are normal and symmetric.   Psychiatric:         Attention and Perception: He is inattentive.         Mood and Affect: Mood is depressed.         Speech: Speech normal.         Behavior: Behavior is withdrawn.         Thought Content: Thought content includes homicidal and suicidal ideation. Thought content includes suicidal plan.         Judgment: Judgment normal.           Diagnostic Study Results     Labs -  Recent Results (from the past 12 hour(s))   ETHYL ALCOHOL    Collection Time: 01/13/19  3:06 AM   Result Value Ref Range    ALCOHOL(ETHYL),SERUM <3 0 - 3 MG/DL  CBC WITH AUTOMATED DIFF    Collection Time: 01/13/19  3:06 AM   Result Value Ref Range    WBC 14.1 (H) 4.6 - 13.2 K/uL    RBC 5.60 (H) 4.70 - 5.50 M/uL    HGB 17.0 (H) 13.0 - 16.0 g/dL    HCT 41.7 (H) 40.8 - 48.0 %    MCV 89.8 74.0 - 97.0 FL    MCH 30.4 24.0 - 34.0 PG    MCHC 33.8 31.0 - 37.0 g/dL    RDW 14.4 81.8 - 56.3 %    PLATELET 295 135 - 420 K/uL    MPV 10.1 9.2 - 11.8 FL    NEUTROPHILS 79 (H) 40 - 73 %    LYMPHOCYTES 14 (L) 21 - 52 %    MONOCYTES 6 3 - 10 %     EOSINOPHILS 1 0 - 5 %    BASOPHILS 0 0 - 2 %    ABS. NEUTROPHILS 11.1 (H) 1.8 - 8.0 K/UL    ABS. LYMPHOCYTES 2.0 0.9 - 3.6 K/UL    ABS. MONOCYTES 0.9 0.05 - 1.2 K/UL    ABS. EOSINOPHILS 0.1 0.0 - 0.4 K/UL    ABS. BASOPHILS 0.0 0.0 - 0.1 K/UL    DF AUTOMATED     METABOLIC PANEL, BASIC    Collection Time: 01/13/19  3:06 AM   Result Value Ref Range    Sodium 138 136 - 145 mmol/L    Potassium 3.8 3.5 - 5.5 mmol/L    Chloride 107 100 - 111 mmol/L    CO2 29 21 - 32 mmol/L    Anion gap 2 (L) 3.0 - 18 mmol/L    Glucose 100 (H) 74 - 99 mg/dL    BUN 9 7.0 - 18 MG/DL    Creatinine 1.49 0.6 - 1.3 MG/DL    BUN/Creatinine ratio 9 (L) 12 - 20      GFR est AA >60 >60 ml/min/1.53m2    GFR est non-AA >60 >60 ml/min/1.61m2    Calcium 9.8 8.5 - 10.1 MG/DL       Radiologic Studies -   No orders to display         Medical Decision Making   Provider Notes (Medical Decision Making):  MDM  Number of Diagnoses or Management Options  Diagnosis management comments: Suicidal ideation       I am the first provider for this patient.    I reviewed the vital signs, available nursing notes, past medical history, past surgical history, family history and social history.    Vital Signs-Reviewed the patient's vital signs.    Records Reviewed: Nursing Notes (Time of Review: 3:30 AM)    ED Course: Progress Notes, Reevaluation, and Consults:    Labs essentially normal with the exception of wbc 14.1. Etoh negative.  4:30 AM 01/13/2019    Consult:  Discussed care with Crisis Services. Standard discussion; including history of patient???s chief complaint, available diagnostic results, and treatment course. Will call psychiatrist for admission.  5:50 AM    Consult:  Discussed care with Crisis Services. Standard discussion; including history of patient???s chief complaint, available diagnostic results, and treatment course. Awaiting for crisis stabilization.  6:54 AM        Diagnosis         Clinical Impression:   1. Suicidal ideation         Disposition: Admitted       Attestation        Provider Attestation:  I personally performed the services described in the documentation, reviewed the documentation and it accurately and completely records my words and actions utilizing the [facility] software January 13, 2019 at 5:52 AM - Wiley Flicker, Helene Shoe, DO    Disclaimer.  It is dictated using utilizing voice recognition software.  Unfortunately this leads to occasional typographical errors.  I apologize in advance if the situation occurs.  If questions arise please do not hesitate to contact me or call our department.

## 2019-01-13 NOTE — ED Triage Notes (Addendum)
Patient presents to the ED with complaints of hallucinations. Patient states "I have not been taking my medication for as long as I can remember." Patient admits to SI and HI. Patient drowsy in triage unable to hold conversation

## 2019-01-13 NOTE — Other (Signed)
Patient seen in ED 21 at the request of Dr. Alfonso Patten. [place]. Patient endorsed suicidal ideations without a definitive plan. "There is more than one way I can do it." He endorsed homicidal ideations towards "weird and wicked people." He reports he is having auditory hallucinations. "The voices are telling me to hang myself. He also reports visual hallucinations. "I keep seeing my ancestors over and over again." He reports he has Bipolar and ADHD. He reports he is prescribed Haldol, Risperdal and Seroquel. He reports he has not had his medication "in months." He reports he smokes marijuana daily. He denied access to weapons. He reports he currently has legal issues. "I have a court case at the end of the month."  He reports he was charged with domestic violence.                                                 Mental Status Exam     The patient's appearance umkempt. The patient's behavior agitated. The patient is oriented to person, place, time and situation. The patient's speech is normal. The patient's mood is depressed. The patient's range of affect is flat. The patient's thought content unfocused. The patient's thought process is blaming others. The patient's memory shows no evidence of impairment. The patient's appetite shows no evidence of impairment. The patient's sleep shows no evidence of impairment. The patient's insight limited. The patient's judgement limited.    Disposition: Discussed with Dr. Renae Gloss and charge nurse; Patient will be referred for Crisis Stabilization Unit bed

## 2019-01-13 NOTE — ED Notes (Signed)
Spoke with Angela, she says [facility] did not except the patient and referred him back to Crisis.

## 2019-01-13 NOTE — Other (Signed)
Crisis Note: Spoke with Barnie Mort, [phone]. Clinicals submitted for review; awaiting response.

## 2019-01-13 NOTE — ED Notes (Signed)
Received report and care of pt from Kym, RN. Meal tray brought to bedside.   Pt sitting up in bed, tearful, and reports his brain is too advanced, there is a constant struggle between good and evil, and he can see things before it happens and it's mostly bad things he sees.  Pt does not want to describe what he sees and starts crying when he beings to speak about the voices he hears and visions he sees.  Pt reports SI with a plan and has attempted before. Pt sitting up in bed and ambulated into hallway.  Provider in hallway with patient and updated with plan of care.  Orders received.  Charge nurse updated RN with plan of care and pt updated.  Pt laying in bed in no acute distress with unlabored breathing.
